# Patient Record
Sex: Male | Born: 1960 | Race: Black or African American | Hispanic: No | Marital: Married | Smoking: Never smoker
Health system: Southern US, Community
[De-identification: ages and names within clinical notes are randomized; demographics above are authoritative.]

## PROBLEM LIST (undated history)

## (undated) DIAGNOSIS — E785 Hyperlipidemia, unspecified: Secondary | ICD-10-CM

## (undated) DIAGNOSIS — J45909 Unspecified asthma, uncomplicated: Secondary | ICD-10-CM

## (undated) DIAGNOSIS — I10 Essential (primary) hypertension: Secondary | ICD-10-CM

## (undated) DIAGNOSIS — M109 Gout, unspecified: Secondary | ICD-10-CM

## (undated) HISTORY — PX: HERNIA REPAIR: SHX51

---

## 2011-04-05 ENCOUNTER — Emergency Department: Payer: Self-pay | Admitting: Emergency Medicine

## 2011-05-09 ENCOUNTER — Emergency Department: Payer: Self-pay | Admitting: *Deleted

## 2011-05-09 LAB — BASIC METABOLIC PANEL
BUN: 10 mg/dL (ref 7–18)
Calcium, Total: 8.9 mg/dL (ref 8.5–10.1)
Co2: 28 mmol/L (ref 21–32)
Creatinine: 1.18 mg/dL (ref 0.60–1.30)
EGFR (African American): >60
Potassium: 4.1 mmol/L (ref 3.5–5.1)
Sodium: 144 mmol/L (ref 136–145)

## 2011-05-09 LAB — CBC
HCT: 45.7 % (ref 40.0–52.0)
HGB: 15.1 g/dL (ref 13.0–18.0)
MCH: 30.4 pg (ref 26.0–34.0)
MCHC: 33.1 g/dL (ref 32.0–36.0)
MCV: 92 fL (ref 80–100)
RDW: 13.6 % (ref 11.5–14.5)

## 2011-05-09 LAB — TROPONIN I: Troponin-I: <0.02 ng/mL

## 2013-10-28 ENCOUNTER — Ambulatory Visit: Payer: Self-pay | Admitting: Physician Assistant

## 2013-10-28 LAB — URIC ACID: Uric Acid: 7.5 mg/dL — ABNORMAL HIGH (ref 3.5–7.2)

## 2013-10-28 LAB — CBC WITH DIFFERENTIAL/PLATELET
Basophil #: 0 10*3/uL (ref 0.0–0.1)
Basophil %: 0.4 %
EOS ABS: 0.2 10*3/uL (ref 0.0–0.7)
Eosinophil %: 1.4 %
HCT: 43.7 % (ref 40.0–52.0)
HGB: 14.1 g/dL (ref 13.0–18.0)
LYMPHS ABS: 1.6 10*3/uL (ref 1.0–3.6)
Lymphocyte %: 14.1 %
MCH: 29.7 pg (ref 26.0–34.0)
MCHC: 32.4 g/dL (ref 32.0–36.0)
MCV: 92 fL (ref 80–100)
MONOS PCT: 8.3 %
Monocyte #: 0.9 x10 3/mm (ref 0.2–1.0)
Neutrophil #: 8.5 10*3/uL — ABNORMAL HIGH (ref 1.4–6.5)
Neutrophil %: 75.8 %
Platelet: 197 10*3/uL (ref 150–440)
RBC: 4.76 10*6/uL (ref 4.40–5.90)
RDW: 13.4 % (ref 11.5–14.5)
WBC: 11.2 10*3/uL — AB (ref 3.8–10.6)

## 2013-12-21 IMAGING — CR DG CHEST 2V
1 series · 2 of 2 positions shown · non-contrast
Comparison: none

REASON FOR EXAM: L-sided rib/back pain
COMMENTS:

PROCEDURE:     DXR - DXR CHEST PA (OR AP) AND LATERAL  - May 09, 2011  [DATE]
RESULT:     There is no pneumothorax, infiltrate, edema, effusion or
cardiomegaly. The bony structures appear intact.

[Series 1: w chest pa · 0.14mm/px · 2 of 2 slices shown]
[im 1/2]
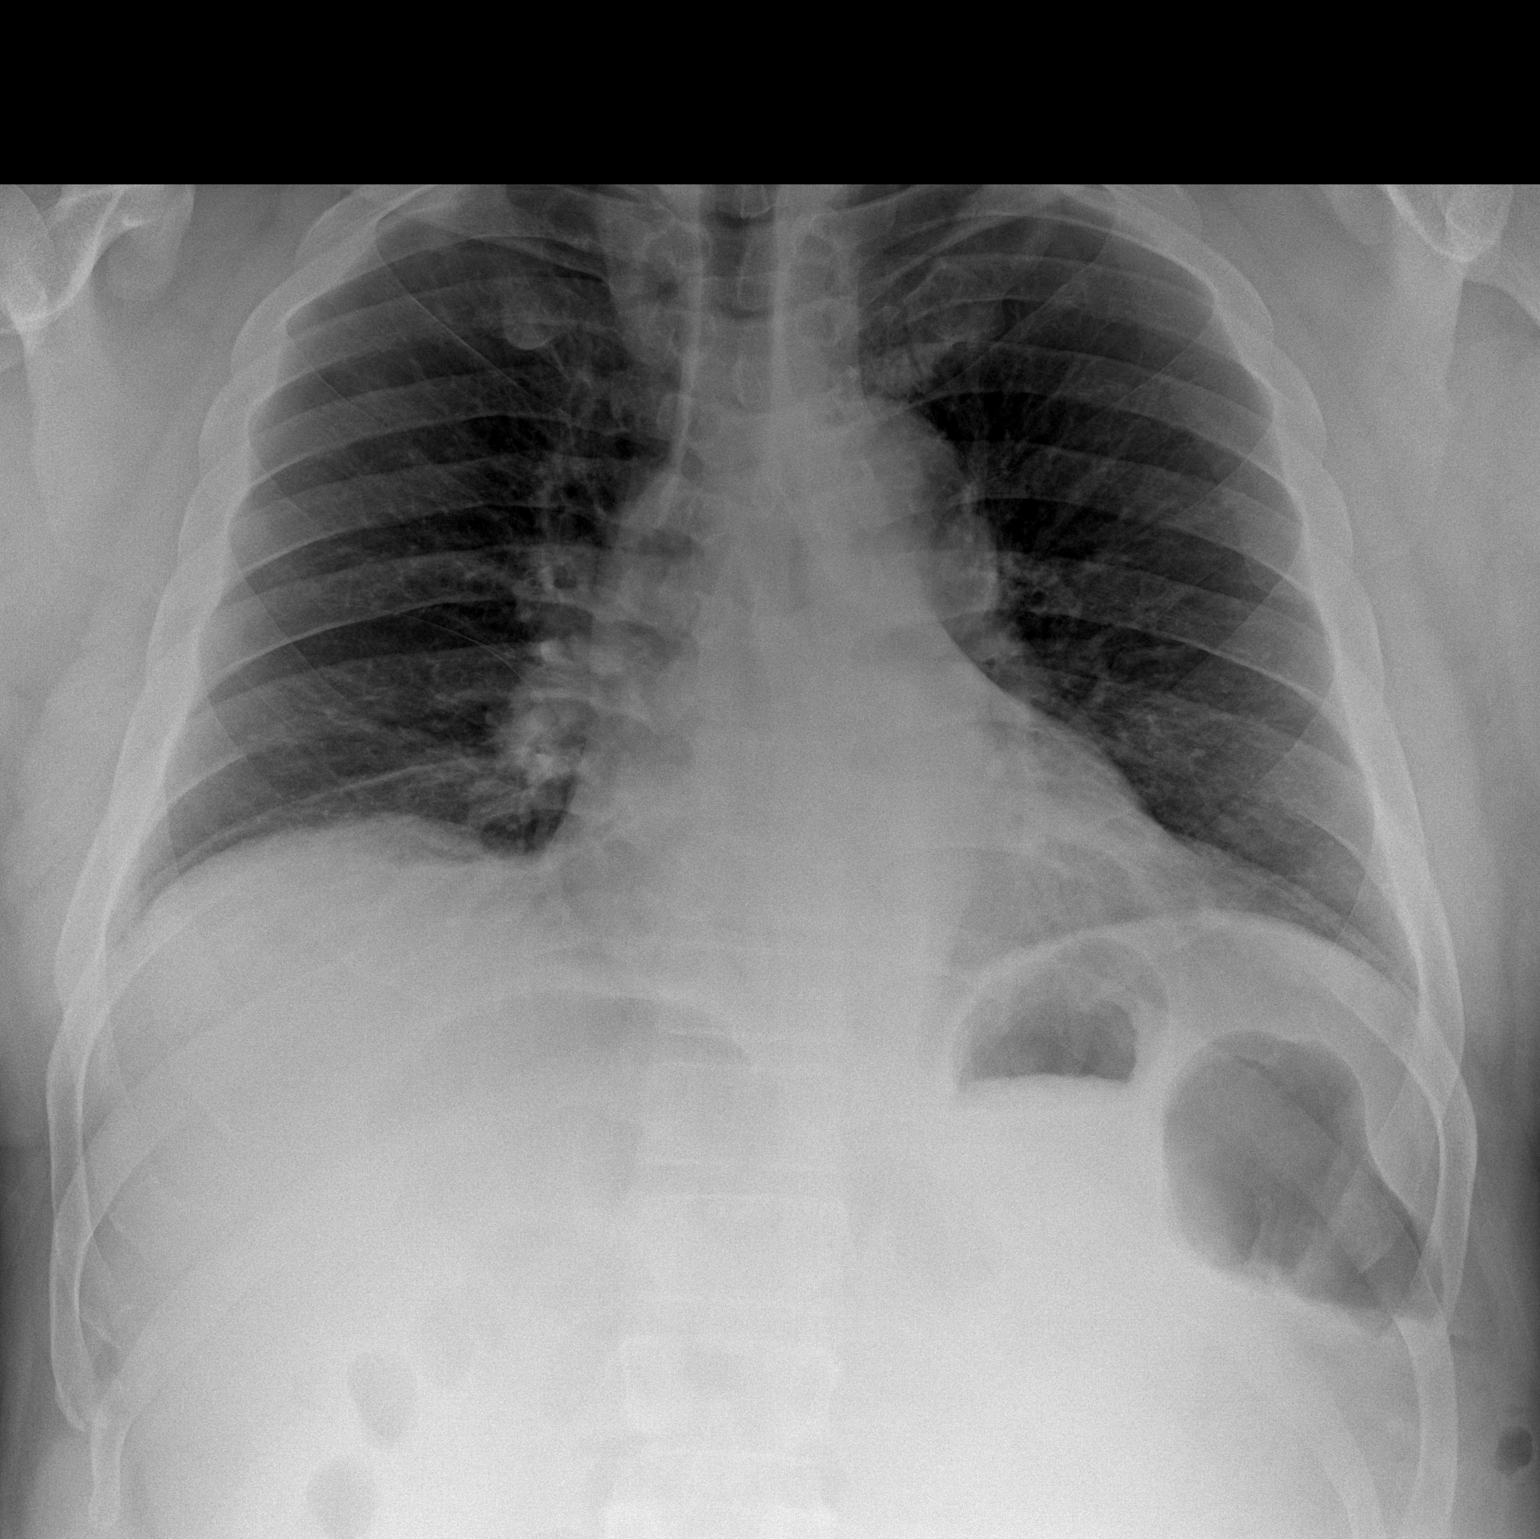
[im 2/2]
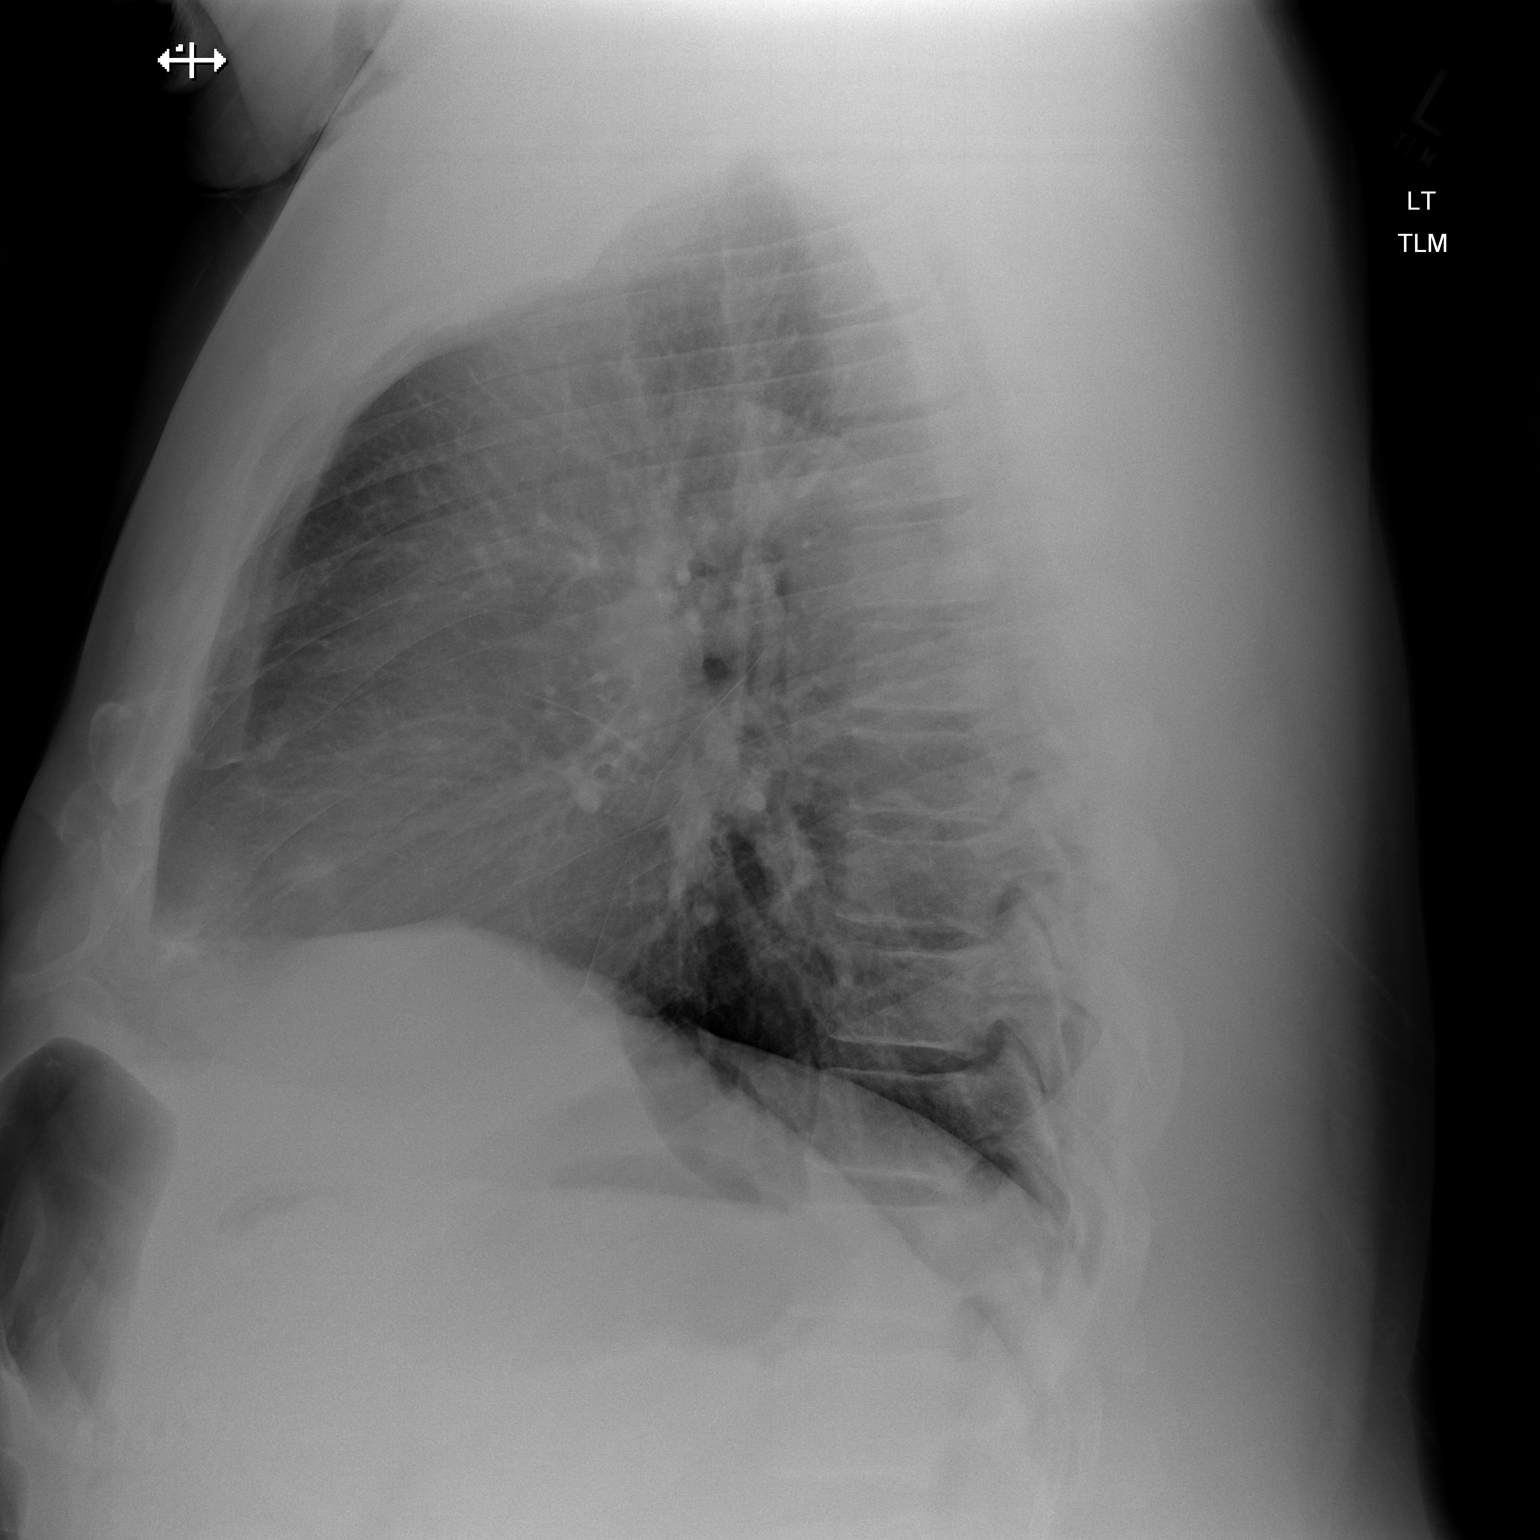

[2 of 2 positions shown; findings below may reference images not displayed]

IMPRESSION: No acute cardiopulmonary disease. Stable appearance since
05 April, 2011.

## 2016-01-02 ENCOUNTER — Ambulatory Visit
Admission: EM | Admit: 2016-01-02 | Discharge: 2016-01-02 | Disposition: A | Discharge status: Home / Self Care | Payer: BLUE CROSS/BLUE SHIELD | Attending: Family Medicine | Admitting: Family Medicine

## 2016-01-02 DIAGNOSIS — S0501XA Injury of conjunctiva and corneal abrasion without foreign body, right eye, initial encounter: Secondary | ICD-10-CM

## 2016-01-02 MED ORDER — TETRACAINE HCL 0.5 % OP SOLN: 2.0000 [drp] | Freq: Once | OPHTHALMIC | Status: DC

## 2016-01-02 MED ORDER — GENTAMICIN SULFATE 0.3 % OP SOLN: 1.0000 [drp] | Freq: Four times a day (QID) | OPHTHALMIC | 0 refills | Status: AC

## 2016-01-02 MED ORDER — FLUORESCEIN SODIUM 1 MG OP STRP: 1.0000 | ORAL_STRIP | Freq: Once | OPHTHALMIC | Status: DC

## 2016-01-02 NOTE — ED Provider Notes (Signed)
CSN: [id number]     Arrival date & time 01/02/16  0909 History   First MD Initiated Contact with Patient 01/02/16 1014     Chief Complaint  Patient presents with  . Foreign Body in Eye   (Consider location/radiation/quality/duration/timing/severity/associated sxs/prior Treatment) 55 year old male presents with right eye irritation and swelling that started this morning. He was outside playing horseshoes with friends yesterday when he thinks he may have gotten some dirt in his eyes. Left eye feels fine but woke up with right eye pain and sensitive to light. Has not placed any drops in his eyes. Does not wear contacts. Denies any discharge. Also no history of elevated blood pressure.    The history is provided by the patient.    History reviewed. No pertinent past medical history. History reviewed. No pertinent surgical history. History reviewed. No pertinent family history. Social History  Substance Use Topics  . Smoking status: Never Smoker  . Smokeless tobacco: Never Used  . Alcohol use Yes     Comment: several times per week    Review of Systems  Constitutional: Negative for chills, fatigue and fever.  HENT: Negative for congestion, ear pain and rhinorrhea.   Eyes: Positive for photophobia, pain and redness. Negative for discharge, itching and visual disturbance.  Respiratory: Negative for cough, chest tightness, shortness of breath and wheezing.   Cardiovascular: Negative for chest pain.  Neurological: Negative for dizziness, weakness and headaches.    Allergies  Review of patient's allergies indicates no known allergies.  Home Medications   Prior to Admission medications   Medication Sig Start Date End Date Taking? Authorizing Provider  gentamicin (GARAMYCIN) 0.3 % ophthalmic solution Place 1 drop into the right eye 4 (four) times daily. 01/02/16 01/07/16  Sudie GrumblingAnn Berry , NP   Meds Ordered and Administered this Visit   Medications - No data to display  BP (!) 176/90  (BP Location: Left Arm)   Pulse 76   Temp 97.8 F (36.6 C) (Oral)   Resp 20   Ht 6\' 4"  (1.93 m)   Wt (!) 345 lb (156.5 kg)   SpO2 96%   BMI 41.99 kg/m  No data found.   Physical Exam  Constitutional: He is oriented to person, place, and time. He appears well-developed and well-nourished. No distress.  HENT:  Head: Normocephalic and atraumatic.  Right Ear: Hearing, tympanic membrane, external ear and ear canal normal.  Left Ear: Hearing, tympanic membrane, external ear and ear canal normal.  Nose: Nose normal.  Mouth/Throat: Uvula is midline, oropharynx is clear and moist and mucous membranes are normal.  Eyes: EOM and lids are normal. Pupils are equal, round, and reactive to light. Right eye exhibits no discharge, no exudate and no hordeolum. No foreign body present in the right eye. Left eye exhibits no discharge, no exudate and no hordeolum. No foreign body present in the left eye. Right conjunctiva is injected. Left conjunctiva is not injected.  Fundoscopic exam:      The right eye shows no papilledema. The right eye shows red reflex.       The left eye shows no papilledema. The left eye shows red reflex.  Slit lamp exam:      The right eye shows corneal abrasion. The right eye shows no corneal ulcer and no foreign body.       The left eye shows no corneal ulcer and no foreign body.    Small corneal abrasion present at 9 o'clock just lateral to  iris as well as small slit-like abrasion present around 7 o'clock on right eye. No foreign bodies seen.   Cardiovascular: Normal rate, regular rhythm and normal heart sounds.   Pulmonary/Chest: Effort normal and breath sounds normal.  Neurological: He is alert and oriented to person, place, and time.  Skin: Skin is warm and dry.  Psychiatric: He has a normal mood and affect. His behavior is normal. Judgment and thought content normal.    Urgent Care Course   Clinical Course    Procedures (including critical care time)  Labs  Review Labs Reviewed - No data to display  Imaging Review No results found.   Visual Acuity Review  Right Eye Distance:  (20/100 uncorrected) Left Eye Distance:  (20/40 uncorrected) Bilateral Distance:  (20/40 uncorrected)  Right Eye Near:   Left Eye Near:    Bilateral Near:         MDM   1. Corneal abrasion, right, initial encounter   2. Incidental finding of elevated blood pressure  Discussed that corneal abrasion should resolve on own within 48 hours. Recommend Gentamicin eye drops in right eye to prevent infection. Follow-up with an eye doctor in 2 days if not improving. Also discussed that he needs to recheck his blood pressure- may be elevated today due to pain from eye condition. Recommend patient follow-up with a PCP if blood pressure remains >140/90.    Sudie Grumbling, NP 01/03/16 [phone]

## 2016-01-02 NOTE — Discharge Instructions (Signed)
Start Gentamicin (antibiotic) drops to prevent infection due to a scratch on your right eye. Follow-up with an eye doctor if symptoms do not resolve within 3 days.

## 2016-01-02 NOTE — ED Triage Notes (Signed)
Pt awoke today feeling like something was in his eye. Denies drainage. Pain 5/10

## 2016-06-11 IMAGING — CR LEFT WRIST - COMPLETE 3+ VIEW
3 series · 3 of 3 positions shown · non-contrast
Comparison: None.

CLINICAL DATA: Left wrist pain and swelling.  No known injuries.

EXAM:
LEFT WRIST - COMPLETE 3+ VIEW

[wrist pa]
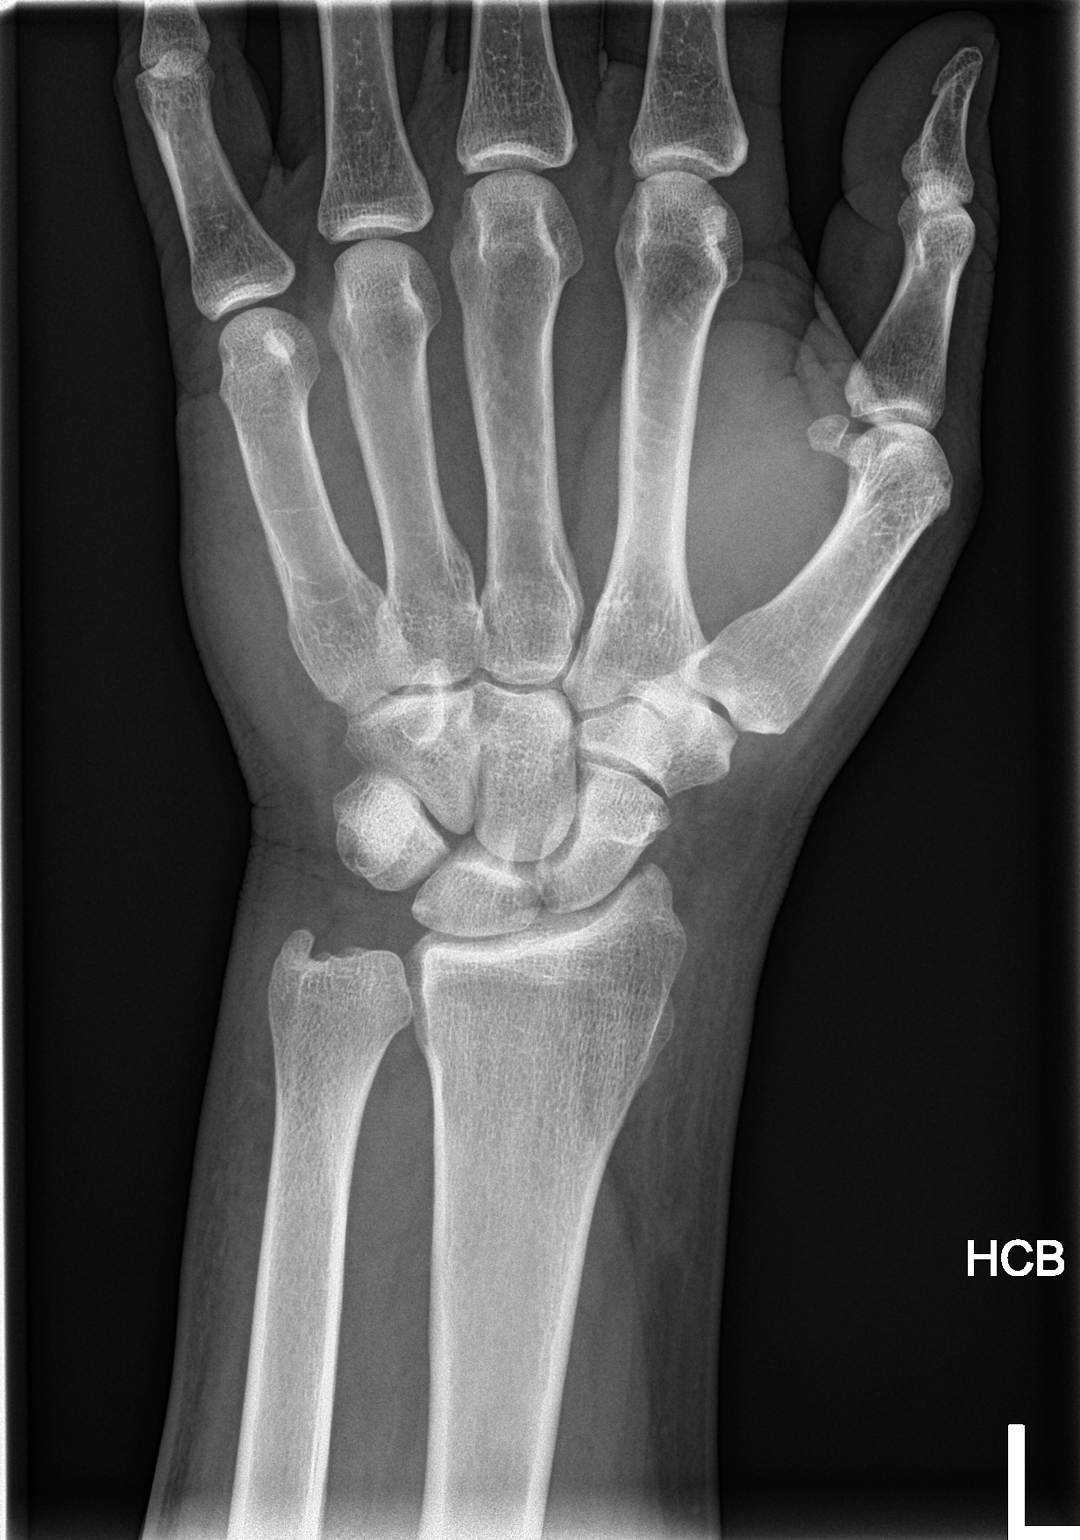

[wrist obl]
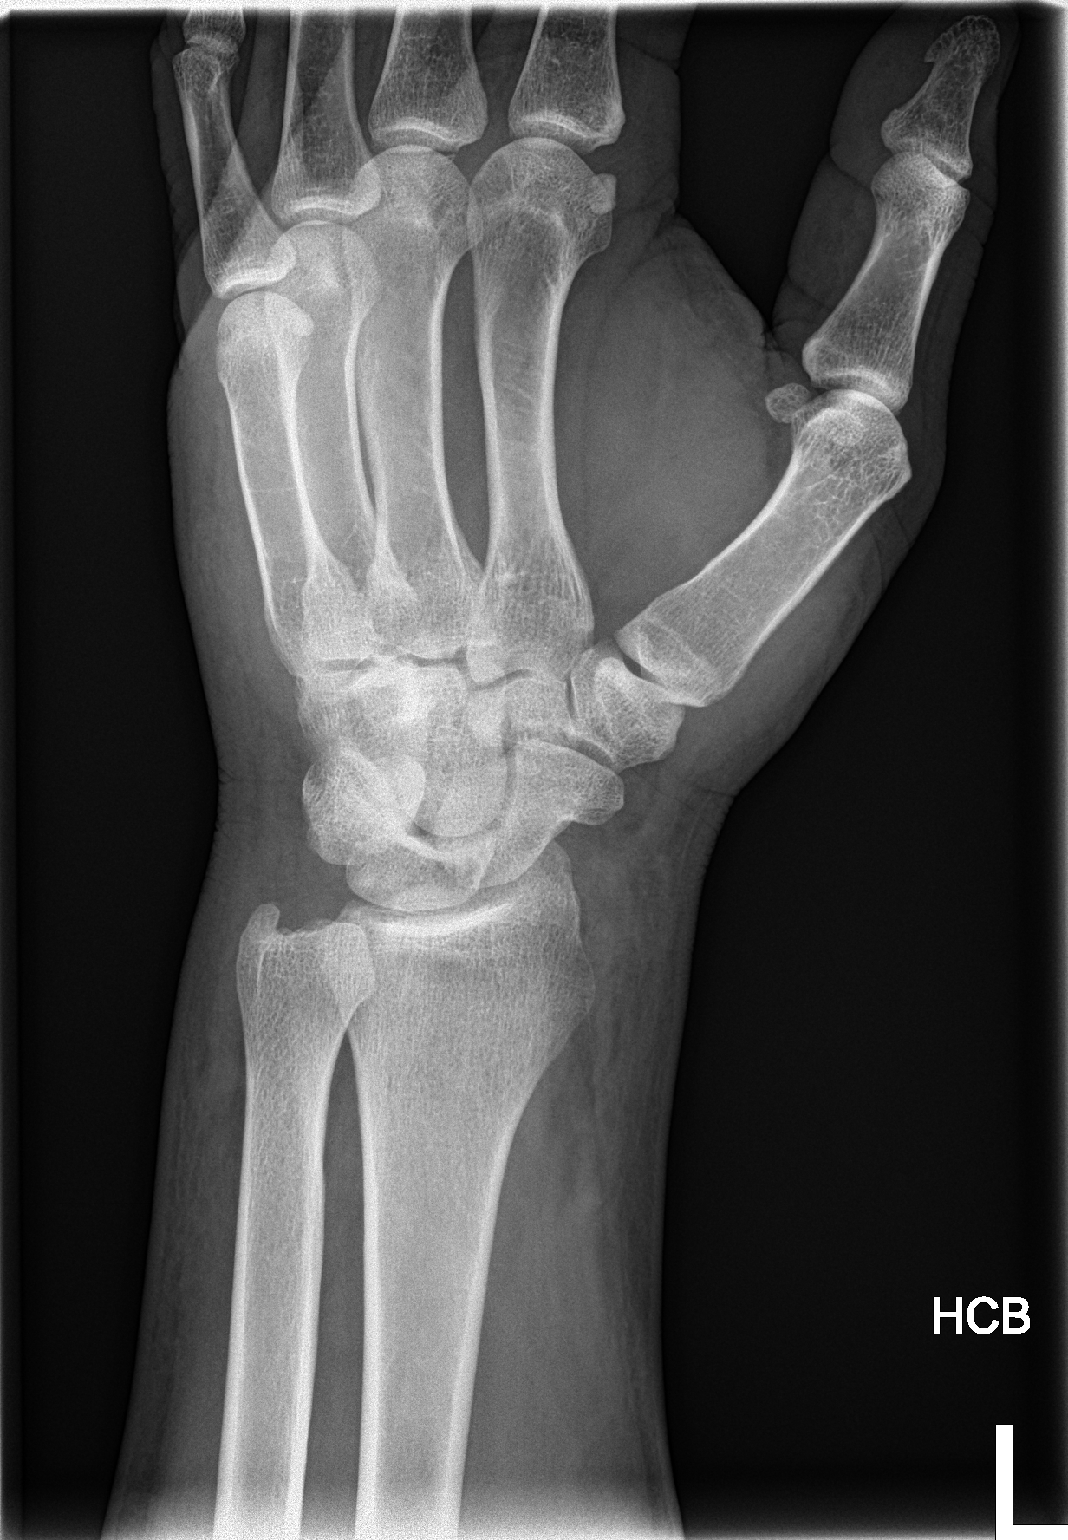

[wrist lat]
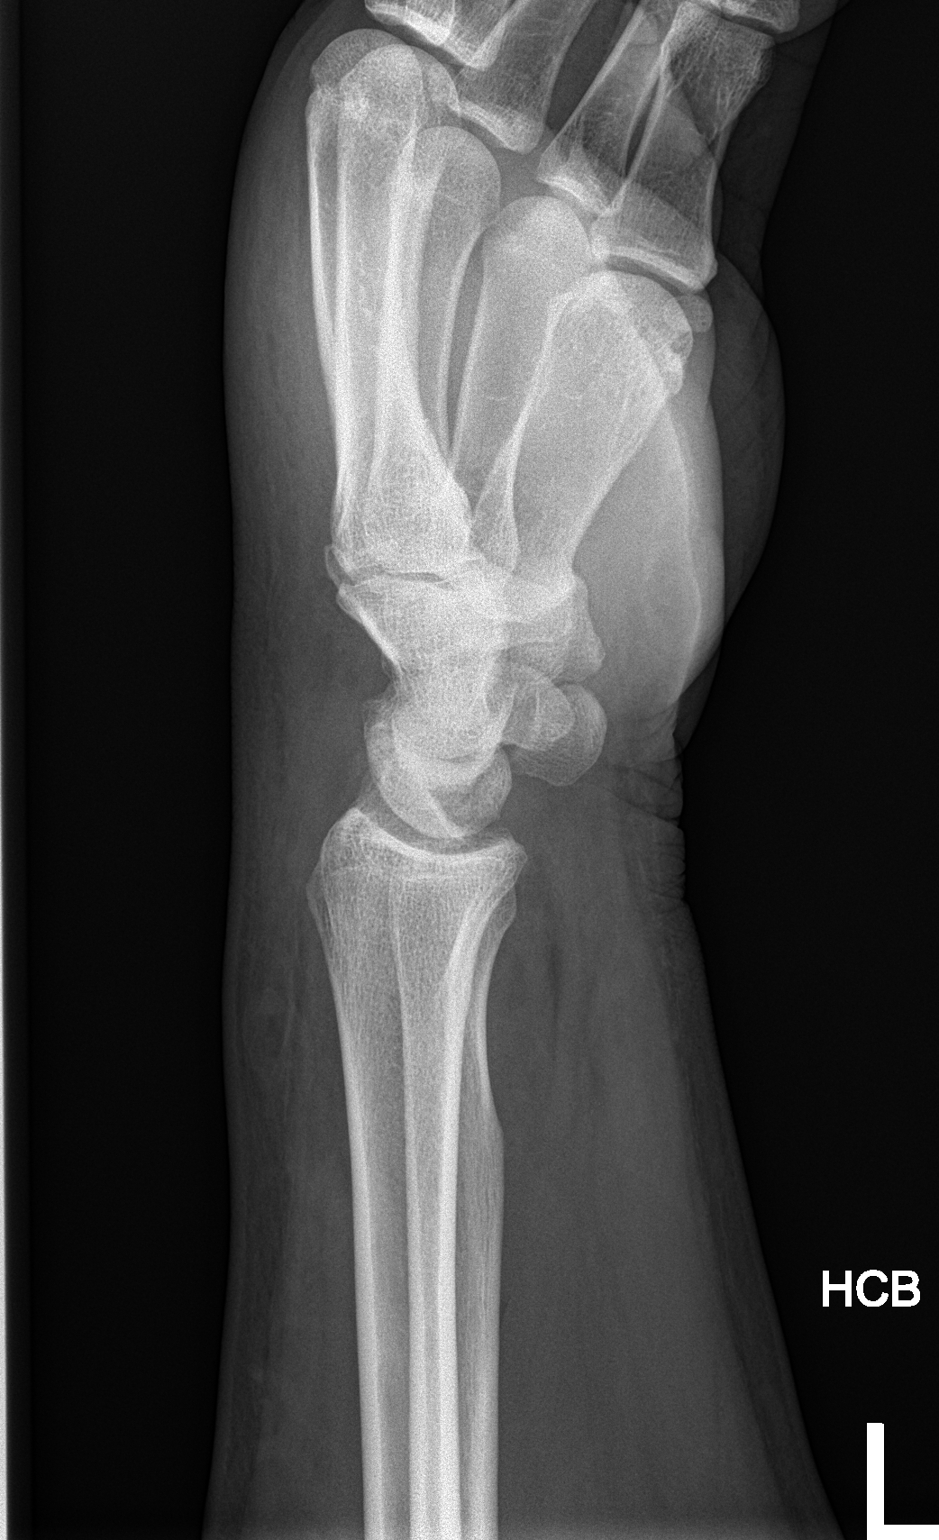

[3 of 3 positions shown; findings below may reference images not displayed]

FINDINGS: Dorsal soft tissue swelling overlying the wrist and the metacarpals
of the hand. Probable joint effusion involving the wrist. No
evidence of acute or subacute fracture or dislocation. Well
preserved joint spaces. Well preserved bone mineral density. No
erosions or other intrinsic osseous abnormality.
IMPRESSION: No osseous abnormality.  Probable joint effusion.

## 2019-01-05 ENCOUNTER — Other Ambulatory Visit: Payer: Self-pay

## 2019-01-05 DIAGNOSIS — Z20822 Contact with and (suspected) exposure to covid-19: Secondary | ICD-10-CM

## 2019-01-06 LAB — NOVEL CORONAVIRUS, NAA: SARS-CoV-2, NAA: NOT DETECTED

## 2019-06-18 ENCOUNTER — Ambulatory Visit: Payer: Self-pay | Attending: Internal Medicine

## 2019-06-18 ENCOUNTER — Other Ambulatory Visit: Payer: Self-pay

## 2019-06-18 DIAGNOSIS — Z23 Encounter for immunization: Secondary | ICD-10-CM

## 2019-06-18 NOTE — Progress Notes (Signed)
   Covid-19 Vaccination Clinic  Name:  Dylan Travis    MRN: [id number] DOB: [birth year]  06/18/2019  Mr. [name] was observed post Covid-19 immunization for 15 minutes without incident. He was provided with Vaccine Information Sheet and instruction to access the V-Safe system.   Mr. [name] was instructed to call 911 with any severe reactions post vaccine: Marland Kitchen Difficulty breathing  . Swelling of face and throat  . A fast heartbeat  . A bad rash all over body  . Dizziness and weakness   Immunizations Administered    Name Date Dose VIS Date Route   Pfizer COVID-19 Vaccine 06/18/2019 11:53 AM 0.3 mL 03/18/2019 Intramuscular   Manufacturer: [employer], [employer]   Lot: [id number]   NDC: [address]-6

## 2019-07-13 ENCOUNTER — Ambulatory Visit: Payer: Self-pay | Attending: Internal Medicine

## 2019-07-13 DIAGNOSIS — Z23 Encounter for immunization: Secondary | ICD-10-CM

## 2019-07-13 NOTE — Progress Notes (Signed)
   Covid-19 Vaccination Clinic  Name:  Dylan Travis    MRN: [id number] DOB: Jun 18, 1960  07/13/2019  [name] was observed post Covid-19 immunization for 15 minutes without incident. He was provided with Vaccine Information Sheet and instruction to access the V-Safe system.   [name] was instructed to call 911 with any severe reactions post vaccine: Marland Kitchen Difficulty breathing  . Swelling of face and throat  . A fast heartbeat  . A bad rash all over body  . Dizziness and weakness   Immunizations Administered    Name Date Dose VIS Date Route   Pfizer COVID-19 Vaccine 07/13/2019  4:17 PM 0.3 mL 03/18/2019 Intramuscular   Manufacturer: [employer], [employer]   Lot: [id number]   NDC: [address]-0

## 2019-09-08 ENCOUNTER — Other Ambulatory Visit: Payer: Self-pay

## 2019-09-08 ENCOUNTER — Ambulatory Visit
Admission: EM | Admit: 2019-09-08 | Discharge: 2019-09-08 | Disposition: A | Discharge status: Home / Self Care | Payer: Managed Care, Other (non HMO)

## 2019-09-08 ENCOUNTER — Encounter: Payer: Self-pay | Admitting: Emergency Medicine

## 2019-09-08 DIAGNOSIS — M10271 Drug-induced gout, right ankle and foot: Secondary | ICD-10-CM

## 2019-09-08 DIAGNOSIS — M79671 Pain in right foot: Secondary | ICD-10-CM

## 2019-09-08 HISTORY — DX: Essential (primary) hypertension: I10

## 2019-09-08 HISTORY — DX: Gout, unspecified: M10.9

## 2019-09-08 HISTORY — DX: Hyperlipidemia, unspecified: E78.5

## 2019-09-08 HISTORY — DX: Unspecified asthma, uncomplicated: J45.909

## 2019-09-08 MED ORDER — PREDNISONE 10 MG (21) PO TBPK: ORAL_TABLET | ORAL | 0 refills | Status: DC

## 2019-09-08 NOTE — Discharge Instructions (Signed)
It was very nice seeing you today in clinic. Thank you for entrusting me with your care.   Ice and elevate your foot. Use medications as prescribed. Make sure you are drinking lots of water.   Make arrangements to follow up with your regular doctor in 1 week for re-evaluation if not improving. If your symptoms/condition worsens, please seek follow up care either here or in the ER. Please remember, our [facility] providers are "right here with you" when you need [place].   Again, it was my pleasure to take care of you today. Thank you for choosing our clinic. I hope that you start to feel better quickly.   Quentin Mulling, MSN, APRN, FNP-C, CEN Advanced Practice Provider [facility] [place] [facility]

## 2019-09-08 NOTE — ED Triage Notes (Signed)
Patient in today c/o left foot pain x 1 week. Patient has a history of gout. Patient saw PCP on 08/29/19 and his HTN medication was changed. Patient states PCP told him that the new medication may cause a flare in his gout.

## 2019-09-09 NOTE — ED Provider Notes (Addendum)
[place], [place]   Name: Dylan Travis DOB: 1961-03-17 MRN: [id number] CSN: [id number] PCP: Patient, No Pcp Per  Arrival date and time:  09/08/19 1241  Chief Complaint:  Foot Pain (left)  NOTE: Prior to seeing the patient today, I have reviewed the triage nursing documentation and vital signs. Clinical staff has updated patient's PMH/PSHx, current medication list, and drug allergies/intolerances to ensure comprehensive history available to assist in medical decision making.   History:   HPI: Dylan Travis is a 59 y.o. male who presents today with complaints of pain in his LEFT foot that started on 09/02/2019. Symptoms have progressively worsened. Patient reports a PMH that is (+) for gout with infrequent flares. Patient is aware of triggering foods and denies recent purine rich intake. Of note, patient started on new thiazide diuretic (chlorthalidone) the day before his symptoms started last week (09/01/2019), which is PCP advised him was a distinct possibility given his history of gout. Patient has been conservatively managing his symptoms with APAP and IBU, however notes that his pain continues to worsen. He reports (+) difficulties with ambulation secondary to his foot pain. He presents today with an elevated blood pressure, which patient attributes to his pain that he rates 8/10. He denies associated chest pain, shortness of breath, palpitations, and headaches.  Past Medical History:  Diagnosis Date  . Asthma   . Gout   . Hyperlipidemia   . Hypertension     Past Surgical History:  Procedure Laterality Date  . HERNIA REPAIR      Family History  Problem Relation Age of Onset  . Hypertension Mother   . Diabetes Mother   . Cancer Father   . Hypertension Father     Social History   Tobacco Use  . Smoking status: Never Smoker  . Smokeless tobacco: Never Used  Substance Use Topics  . Alcohol use: Yes    Alcohol/week: 8.0 standard drinks    Types: 6 Cans of beer, 2 Standard drinks  or equivalent per week    Comment: weekends  . Drug use: No    There are no problems to display for this patient.   Home Medications:    Current Meds  Medication Sig  . albuterol (VENTOLIN HFA) 108 (90 Base) MCG/ACT inhaler Inhale 2 puffs into the lungs every 4 (four) hours as needed for wheezing or shortness of breath.  . allopurinol (ZYLOPRIM) 100 MG tablet Take 100 mg by mouth daily.  Marland Kitchen atorvastatin (LIPITOR) 40 MG tablet Take 40 mg by mouth at bedtime.  . chlorthalidone (HYGROTON) 25 MG tablet Take 25 mg by mouth daily.  . metoprolol succinate (TOPROL-XL) 25 MG 24 hr tablet Take 25 mg by mouth daily.    Allergies:   Patient has no known allergies.  Review of Systems (ROS):  Review of systems NEGATIVE unless otherwise noted in narrative H&P section.   Vital Signs: Today's Vitals   09/08/19 1259 09/08/19 1307 09/08/19 1323  BP:  (!) 153/106   Pulse:  100   Resp:  18   Temp:  98.4 F (36.9 C)   TempSrc:  Oral   SpO2:  94%   Weight: (!) 370 lb (167.8 kg)    Height: 6\' 4"  (1.93 m)    PainSc: 8   8     Physical Exam: Physical Exam  Constitutional: He is oriented to person, place, and time and well-developed, well-nourished, and in no distress.  HENT:  Head: Normocephalic and atraumatic.  Eyes: Pupils are equal, round,  and reactive to light.  Cardiovascular: Normal rate, regular rhythm, normal heart sounds and intact distal pulses.  Pulmonary/Chest: Effort normal and breath sounds normal.  Musculoskeletal:     Left foot: Normal range of motion. Swelling and tenderness present. No deformity or crepitus.       Feet:     Comments: LEFT foot swollen, erythematous, and TTP. (+) PMS noted distally; capillary refill WNL.   Neurological: He is alert and oriented to person, place, and time. He has normal sensation, normal strength and normal reflexes. Gait (2/2 acute pain) abnormal.  Skin: Skin is warm and dry. No rash noted. He is not diaphoretic.  Psychiatric: Memory,  affect and judgment normal. His mood appears anxious.  Nursing note and vitals reviewed.   Urgent Care Treatments / Results:   No orders of the defined types were placed in this encounter.   LABS: PLEASE NOTE: all labs that were ordered this encounter are listed, however only abnormal results are displayed. Labs Reviewed - No data to display  EKG: -None  RADIOLOGY: No results found.  PROCEDURES: Procedures  MEDICATIONS RECEIVED THIS VISIT: Medications - No data to display  PERTINENT CLINICAL COURSE NOTES/UPDATES:   Initial Impression / Assessment and Plan / Urgent Care Course:  Pertinent labs & imaging results that were available during my care of the patient were personally reviewed by me and considered in my medical decision making (see lab/imaging section of note for values and interpretations).  Dylan Travis is a 59 y.o. male who presents to [facility] today with complaints of Foot Pain (left)  Patient is well appearing overall in clinic today. He does not appear to be in any acute distress. Presenting symptoms (see HPI) and exam as documented above. Exam consistent with acute flare of patient's known gout. He takes daily anti-gout medication (allopurinol), however it is at a relatively low dose (100 mg). Given his acute flare, will defer up titration of his allopurinol dose to avoid exacerbation of his current flare. He was encouraged to discussed dose increase with his PCP at his next visit. Treating acute flare with a 10 day systemic steroid (prednisone) taper. Reviewed supportive care measures; rest, ice, elevation, and PRN use of APAP for pain. Advised to avoid concurrent NSAIDs use while on steroids in efforts to prevent elevations of his blood pressure. He was advised to increase his oral fluid (water) intake.   Current clinical condition warrants patient being out of work in order to recover from his current injury/illness. He was provided with the appropriate  documentation to provide to his place of employment that will allow for him to RTW on 09/12/2019 with no restrictions.   Discussed follow up with primary care physician in 1 week for re-evaluation. I have reviewed the follow up and strict return precautions for any new or worsening symptoms. Patient is aware of symptoms that would be deemed urgent/emergent, and would thus require further evaluation either here or in the emergency department. At the time of discharge, he verbalized understanding and consent with the discharge plan as it was reviewed with him. All questions were fielded by provider and/or clinic staff prior to patient discharge.    Final Clinical Impressions / Urgent Care Diagnoses:   Final diagnoses:  Acute drug-induced gout of right foot  Foot pain, right    New Prescriptions:  [place] Controlled Substance Registry consulted? Not Applicable  Meds ordered this encounter  Medications  . predniSONE (STERAPRED UNI-PAK 21 TAB) 10 MG (21) TBPK tablet  Sig: Take 5 tabs by mouth daily for 2 days, then 4 tabs for 2 days, then 3 tabs for 2 days, 2 tabs for 2 days, then 1 tab by mouth daily for 2 days    Dispense:  30 tablet    Refill:  0    Recommended Follow up Care:  Patient encouraged to follow up with the following provider within the specified time frame, or sooner as dictated by the severity of his symptoms. As always, he was instructed that for any urgent/emergent care needs, he should seek care either here or in the emergency department for more immediate evaluation.  Follow-up Information    PCP In 1 week.   Why: General reassessment of symptoms if not improving        NOTE: This note was prepared using Lobbyist along with smaller Company secretary. Despite my best ability to proofread, there is the potential that transcriptional errors may still occur from this process, and are completely unintentional.    Karen Kitchens, NP 09/09/19 1706

## 2020-05-30 ENCOUNTER — Encounter: Payer: Self-pay | Admitting: Emergency Medicine

## 2020-05-30 ENCOUNTER — Ambulatory Visit
Admission: EM | Admit: 2020-05-30 | Discharge: 2020-05-30 | Disposition: A | Discharge status: Home / Self Care | Payer: Managed Care, Other (non HMO) | Attending: Physician Assistant | Admitting: Physician Assistant

## 2020-05-30 ENCOUNTER — Other Ambulatory Visit: Payer: Self-pay

## 2020-05-30 DIAGNOSIS — S161XXA Strain of muscle, fascia and tendon at neck level, initial encounter: Secondary | ICD-10-CM | POA: Diagnosis not present

## 2020-05-30 DIAGNOSIS — M542 Cervicalgia: Secondary | ICD-10-CM | POA: Diagnosis not present

## 2020-05-30 MED ORDER — DICLOFENAC SODIUM 75 MG PO TBEC: 75.0000 mg | DELAYED_RELEASE_TABLET | Freq: Two times a day (BID) | ORAL | 0 refills | Status: AC

## 2020-05-30 MED ORDER — TIZANIDINE HCL 4 MG PO TABS: 4.0000 mg | ORAL_TABLET | Freq: Three times a day (TID) | ORAL | 0 refills | Status: AC | PRN

## 2020-05-30 NOTE — ED Triage Notes (Signed)
Pt c/o neck pain. Started about 4 days ago. Denies injury.

## 2020-05-30 NOTE — Discharge Instructions (Signed)
NECK PAIN: Stressed avoiding painful activities. This can exacerbate your symptoms and make them worse.  May apply heat to the areas of pain for some relief. Use medications as directed. Be aware of which medications make you drowsy and do not drive or operate any kind of heavy machinery while using the medication (ie pain medications or muscle relaxers). F/U with PCP for reexamination or return sooner if condition worsens or does not begin to improve over the next few days.   NECK PAIN RED FLAGS: If symptoms get worse than they are right now, you should come back sooner for re-evaluation. If you have increased numbness/ tingling or notice that the numbness/tingling is affecting the legs or saddle region, go to ER. If you ever lose continence go to ER.     

## 2020-05-30 NOTE — ED Provider Notes (Signed)
[facility]    CSN: [id number] Arrival date & time: 05/30/20  0809      History   Chief Complaint Chief Complaint  Patient presents with  . Neck Pain    HPI Dylan Travis is a 60 y.o. male presenting for right-sided neck pain x4 days.  Patient denies any injury and states that he noticed a gradually throughout the day.  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  Pain radiates slightly to the upper and posterior right shoulder.  Denies any radiation to arm.  No associated numbness, weakness or tingling.  Pain is worsened by movement, especially rotation toward the right.  He says he has tried multiple medications at home including Tylenol, NSAIDs and is also tried topical icy hot and heat without relief.  He denies any history of major neck injury or surgery.  His past medical history is significant for hypertension, hyperlipidemia, asthma and gout.  Patient states that he has chronically low oxygen saturation level in the low 90s.  He says he has had a full work-up for this including stress tests and imaging.  He states that he does not feel short of breath and does not have any associated chest pain.  No recent sickness.  Denies any fever, fatigue, cough, congestion or sore throat.  He has no other complaints or concerns today.  HPI  Past Medical History:  Diagnosis Date  . Asthma   . Gout   . Hyperlipidemia   . Hypertension     There are no problems to display for this patient.   Past Surgical History:  Procedure Laterality Date  . HERNIA REPAIR         Home Medications    Prior to Admission medications   Medication Sig Start Date End Date Taking? Authorizing Provider  allopurinol (ZYLOPRIM) 100 MG tablet Take 100 mg by mouth daily. 05/27/19  Yes [provider]  atorvastatin (LIPITOR) 40 MG tablet Take 40 mg by mouth at bedtime. 08/04/19  Yes [provider]  chlorthalidone (HYGROTON) 25 MG tablet  Take 25 mg by mouth daily. 09/02/19  Yes [provider]  diclofenac (VOLTAREN) 75 MG EC tablet Take 1 tablet (75 mg total) by mouth 2 (two) times daily for 15 days. 05/30/20 06/14/20 Yes Eusebio Friendly B, PA-C  metoprolol succinate (TOPROL-XL) 25 MG 24 hr tablet Take 25 mg by mouth daily. 08/29/19  Yes [provider]  tiZANidine (ZANAFLEX) 4 MG tablet Take 1 tablet (4 mg total) by mouth every 8 (eight) hours as needed for up to 10 days for muscle spasms. 05/30/20 06/09/20 Yes Shirlee Latch, PA-C  albuterol (VENTOLIN HFA) 108 (90 Base) MCG/ACT inhaler Inhale 2 puffs into the lungs every 4 (four) hours as needed for wheezing or shortness of breath. 12/09/18 12/09/19  [provider]  predniSONE (STERAPRED UNI-PAK 21 TAB) 10 MG (21) TBPK tablet Take 5 tabs by mouth daily for 2 days, then 4 tabs for 2 days, then 3 tabs for 2 days, 2 tabs for 2 days, then 1 tab by mouth daily for 2 days 09/08/19   [name], NP    Family History Family History  Problem Relation Age of Onset  . Hypertension Mother   . Diabetes Mother   . Cancer Father   . Hypertension Father     Social History Social History   Tobacco Use  . Smoking status: Never Smoker  . Smokeless tobacco: Never Used  Vaping Use  . Vaping Use: Never used  Substance Use Topics  . Alcohol use: Yes    Alcohol/week: 8.0 standard drinks    Types: 6 Cans of beer, 2 Standard drinks or equivalent per week    Comment: weekends  . Drug use: No     Allergies   Patient has no known allergies.   Review of Systems Review of Systems  Constitutional: Negative for fatigue and fever.  HENT: Negative for congestion.   Respiratory: Negative for cough and shortness of breath.   Cardiovascular: Negative for chest pain.  Musculoskeletal: Positive for neck pain. Negative for arthralgias, back pain and neck stiffness.  Skin: Negative for rash.  Neurological: Negative for dizziness, weakness and numbness.     Physical  Exam Triage Vital Signs ED Triage Vitals  Enc Vitals Group     BP 05/30/20 0826 (!) 150/88     Pulse Rate 05/30/20 0826 91     Resp 05/30/20 0826 18     Temp 05/30/20 0826 98.7 F (37.1 C)     Temp Source 05/30/20 0826 Oral     SpO2 05/30/20 0826 92 %     Weight 05/30/20 0824 (!) 369 lb 14.9 oz (167.8 kg)     Height 05/30/20 0824 6\' 4"  (1.93 m)     Head Circumference --      Peak Flow --      Pain Score 05/30/20 0824 8     Pain Loc --      Pain Edu? --      Excl. in GC? --    No data found.  Updated Vital Signs BP (!) 150/88 (BP Location: Right Arm)   Pulse 91   Temp 98.7 F (37.1 C) (Oral)   Resp 18   Ht 6\' 4"  (1.93 m)   Wt (!) 369 lb 14.9 oz (167.8 kg)   SpO2 92% Comment: pt states his o2 always runs in the low 90's  BMI 45.03 kg/m    Physical Exam Vitals and nursing note reviewed.  Constitutional:      General: He is not in acute distress.    Appearance: Normal appearance. He is well-developed and well-nourished. He is not ill-appearing.  HENT:     Head: Normocephalic and atraumatic.     Nose: Nose normal.     Mouth/Throat:     Mouth: Mucous membranes are moist.     Pharynx: Oropharynx is clear.  Eyes:     Conjunctiva/sclera: Conjunctivae normal.  Cardiovascular:     Rate and Rhythm: Normal rate and regular rhythm.     Heart sounds: Normal heart sounds.  Pulmonary:     Effort: Pulmonary effort is normal. No respiratory distress.     Breath sounds: Normal breath sounds. No wheezing, rhonchi or rales.  Musculoskeletal:        General: No edema.     Cervical back: Neck supple. Tenderness (diffuse TTP throughout right trapezius muscle. Painful movement with rotation to right. Slightly reduced ROM in all directions. No stiffness. 5/5 strength bilat UEs) present.  Skin:    General: Skin is warm and dry.  Neurological:     General: No focal deficit present.     Mental Status: He is alert. Mental status is at baseline.     Motor: No weakness.     Gait: Gait  normal.  Psychiatric:        Mood and Affect: Mood and affect and mood normal.  Behavior: Behavior normal.        Thought Content: Thought content normal.      UC Treatments / Results  Labs (all labs ordered are listed, but only abnormal results are displayed) Labs Reviewed - No data to display  EKG   Radiology No results found.  Procedures Procedures (including critical care time)  Medications Ordered in UC Medications - No data to display  Initial Impression / Assessment and Plan / UC Course  I have reviewed the triage vital signs and the nursing notes.  Pertinent labs & imaging results that were available during my care of the patient were reviewed by me and considered in my medical decision making (see chart for details).   60 year old male presenting for right-sided neck pain x4 days.  No injury.  Exam consistent with tenderness of the trapezius muscle, suspect cervical strain or sprain.  Treating patient conservatively at this time with diclofenac sodium, at home Tylenol, topical muscle rubs, and tizanidine as needed for muscle spasm/pain.  Also advised stretches at home.  Work note provided for today.  Patient advised of when to return and ED precautions for neck pain.  Final Clinical Impressions(s) / UC Diagnoses   Final diagnoses:  Acute strain of neck muscle, initial encounter  Neck pain     Discharge Instructions     NECK PAIN: Stressed avoiding painful activities. This can exacerbate your symptoms and make them worse.  May apply heat to the areas of pain for some relief. Use medications as directed. Be aware of which medications make you drowsy and do not drive or operate any kind of heavy machinery while using the medication (ie pain medications or muscle relaxers). F/U with PCP for reexamination or return sooner if condition worsens or does not begin to improve over the next few days.   NECK PAIN RED FLAGS: If symptoms get worse than they are right  now, you should come back sooner for re-evaluation. If you have increased numbness/ tingling or notice that the numbness/tingling is affecting the legs or saddle region, go to ER. If you ever lose continence go to ER.        ED Prescriptions    Medication Sig Dispense Auth. Provider   diclofenac (VOLTAREN) 75 MG EC tablet Take 1 tablet (75 mg total) by mouth 2 (two) times daily for 15 days. 30 tablet Eusebio Friendly B, PA-C   tiZANidine (ZANAFLEX) 4 MG tablet Take 1 tablet (4 mg total) by mouth every 8 (eight) hours as needed for up to 10 days for muscle spasms. 30 tablet Shirlee Latch, PA-C     I have reviewed the PDMP during this encounter.   Shirlee Latch, PA-C 05/30/20 [phone]

## 2020-08-14 ENCOUNTER — Ambulatory Visit
Admission: EM | Admit: 2020-08-14 | Discharge: 2020-08-14 | Disposition: A | Discharge status: Home / Self Care | Payer: Managed Care, Other (non HMO) | Attending: Physician Assistant | Admitting: Physician Assistant

## 2020-08-14 ENCOUNTER — Other Ambulatory Visit: Payer: Self-pay

## 2020-08-14 ENCOUNTER — Encounter: Payer: Self-pay | Admitting: Physician Assistant

## 2020-08-14 DIAGNOSIS — R197 Diarrhea, unspecified: Secondary | ICD-10-CM

## 2020-08-14 DIAGNOSIS — R1010 Upper abdominal pain, unspecified: Secondary | ICD-10-CM | POA: Diagnosis not present

## 2020-08-14 DIAGNOSIS — K529 Noninfective gastroenteritis and colitis, unspecified: Secondary | ICD-10-CM

## 2020-08-14 NOTE — ED Triage Notes (Addendum)
Patient c/o abdominal pain that started last night. He reports diarrhea that started yesterday. Denies nausea and vomiting. He reports he has been able to eat and drink normally. Patient took a home COVID test yesterday and this was negative.

## 2020-08-14 NOTE — Discharge Instructions (Addendum)
ABDOMINAL PAIN/GASTROENTERITIS: Use medications as directed including antiemetics and antidiarrheal medications (Imodium). You must increase fluids and electrolyte replacement, as well as rest over these next several days. If you have any questions or concerns, or if your symptoms are not improving or if especially if they acutely worsen, please call or stop back to the clinic immediately and we will be happy to help you or go to the ER   ABDOMINAL PAIN RED FLAGS: Seek immediate further care if: symptoms last more than 2 days, you are unable to keep fluids down, you see blood or mucus in your stool, you vomit black or dark red material, you have a fever of 101.F or higher, you have localized and/or persistent abdominal pain

## 2020-08-14 NOTE — ED Provider Notes (Signed)
[facility]    CSN: [id number] Arrival date & time: 08/14/20  1441      History   Chief Complaint Chief Complaint  Patient presents with  . Abdominal Pain  . Diarrhea    HPI Dylan Travis is a 60 y.o. male presenting for onset of upper abdominal pain and periumbilical pain last night.  Patient also states that he has had numerous episodes of diarrhea.  He denies any nausea or vomiting.  He has not had any fevers.  Appetite is normal.  Patient denies any cough, congestion, sore throat.  He denies any chest discomfort or shortness of breath.  He does however state that yesterday he had some left-sided rib pain that came and went throughout the day but has since resolved.  It was not associated with any dizziness, weakness, palpitations or sweats.  He denies any pain in his chest currently.  Denies any history of heart problems.  He does have history of asthma.  Also has history of hypertension and hyperlipidemia.  Patient states that his daughter and her child are both sick with similar symptoms but they seem to have more nausea and vomiting.  He believes he may have a "stomach bug."  He has not taken any over-the-counter medications.  He does request a work note due to his continued diarrhea.  He has not noticed any blood in the diarrhea.  He denies any recent antibiotic use or travel outside the country.  No other concerns.  HPI  Past Medical History:  Diagnosis Date  . Asthma   . Gout   . Hyperlipidemia   . Hypertension     There are no problems to display for this patient.   Past Surgical History:  Procedure Laterality Date  . HERNIA REPAIR         Home Medications    Prior to Admission medications   Medication Sig Start Date End Date Taking? Authorizing Provider  allopurinol (ZYLOPRIM) 100 MG tablet Take 100 mg by mouth daily. 05/27/19  Yes [provider]  atorvastatin (LIPITOR) 40 MG tablet Take 40 mg by mouth at bedtime. 08/04/19  Yes [provider]  metoprolol succinate (TOPROL-XL) 25 MG 24 hr tablet Take 25 mg by mouth daily. 08/29/19  Yes [provider]  albuterol (VENTOLIN HFA) 108 (90 Base) MCG/ACT inhaler Inhale 2 puffs into the lungs every 4 (four) hours as needed for wheezing or shortness of breath. 12/09/18 12/09/19  [provider]  chlorthalidone (HYGROTON) 25 MG tablet Take 25 mg by mouth daily. 09/02/19   [provider]  predniSONE (STERAPRED UNI-PAK 21 TAB) 10 MG (21) TBPK tablet Take 5 tabs by mouth daily for 2 days, then 4 tabs for 2 days, then 3 tabs for 2 days, 2 tabs for 2 days, then 1 tab by mouth daily for 2 days 09/08/19   [name], NP    Family History Family History  Problem Relation Age of Onset  . Hypertension Mother   . Diabetes Mother   . Cancer Father   . Hypertension Father     Social History Social History   Tobacco Use  . Smoking status: Never Smoker  . Smokeless tobacco: Never Used  Vaping Use  . Vaping Use: Never used  Substance Use Topics  . Alcohol use: Yes    Alcohol/week: 8.0 standard drinks    Types: 6 Cans of beer, 2 Standard drinks or equivalent per week    Comment: weekends  . Drug  use: No     Allergies   Patient has no known allergies.   Review of Systems Review of Systems  Constitutional: Negative for fatigue and fever.  HENT: Negative for congestion, rhinorrhea and sore throat.   Respiratory: Negative for cough and shortness of breath.   Gastrointestinal: Positive for abdominal pain and diarrhea. Negative for nausea and vomiting.  Musculoskeletal: Negative for myalgias.  Neurological: Negative for weakness, light-headedness and headaches.  Hematological: Negative for adenopathy.     Physical Exam Triage Vital Signs ED Triage Vitals  Enc Vitals Group     BP      Pulse      Resp      Temp      Temp src      SpO2      Weight      Height      Head Circumference      Peak Flow      Pain Score      Pain Loc      Pain  Edu?      Excl. in GC?    No data found.  Updated Vital Signs BP (!) 142/88 (BP Location: Left Arm)   Pulse 96   Temp 98.7 F (37.1 C) (Oral)   Resp 18   Ht 6\' 3"  (1.905 m)   Wt (!) 350 lb (158.8 kg)   SpO2 92%   BMI 43.75 kg/m       Physical Exam Vitals and nursing note reviewed.  Constitutional:      General: He is not in acute distress.    Appearance: Normal appearance. He is well-developed. He is obese. He is not ill-appearing.  HENT:     Head: Normocephalic and atraumatic.     Nose: Nose normal.     Mouth/Throat:     Mouth: Mucous membranes are moist.     Pharynx: Oropharynx is clear.  Eyes:     General: No scleral icterus.    Conjunctiva/sclera: Conjunctivae normal.  Cardiovascular:     Rate and Rhythm: Normal rate and regular rhythm.     Heart sounds: Normal heart sounds.  Pulmonary:     Effort: Pulmonary effort is normal. No respiratory distress.     Breath sounds: Normal breath sounds. No wheezing, rhonchi or rales.  Abdominal:     General: Bowel sounds are increased.     Palpations: Abdomen is soft.     Tenderness: There is abdominal tenderness in the epigastric area and periumbilical area.  Musculoskeletal:     Cervical back: Neck supple.  Skin:    General: Skin is warm and dry.  Neurological:     General: No focal deficit present.     Mental Status: He is alert. Mental status is at baseline.     Motor: No weakness.     Gait: Gait normal.  Psychiatric:        Mood and Affect: Mood normal.        Behavior: Behavior normal.        Thought Content: Thought content normal.      UC Treatments / Results  Labs (all labs ordered are listed, but only abnormal results are displayed) Labs Reviewed - No data to display  EKG   Radiology No results found.  Procedures ED EKG  Date/Time: 08/14/2020 4:33 PM Performed by: 10/14/2020, PA-C Authorized by: Shirlee Latch, PA-C   ECG reviewed by ED Physician in the absence of a cardiologist: yes  Previous ECG:    Previous ECG:  Unavailable Interpretation:    Interpretation: abnormal     Details:  Occasional PVCs Rate:    ECG rate assessment: normal   Rhythm:    Rhythm: sinus rhythm   Ectopy:    Ectopy: PVCs     PVCs:  Infrequent QRS:    QRS axis:  Normal   QRS intervals:  Normal   QRS conduction: normal   ST segments:    ST segments:  Normal T waves:    T waves: normal   Comments:     Sinus rhythm, regular rate, occasional PVCs   (including critical care time)  Medications Ordered in UC Medications - No data to display  Initial Impression / Assessment and Plan / UC Course  I have reviewed the triage vital signs and the nursing notes.  Pertinent labs & imaging results that were available during my care of the patient were reviewed by me and considered in my medical decision making (see chart for details).   60 year old male presenting for upper abdominal pain, periumbilical pain and diarrhea.  Symptoms began last night.  Admits to sick contacts with similar symptoms.  His blood pressure slightly elevated at 142/88.  He is afebrile and other vitals are stable.  His oxygen is decreased at 92% but it was also decreased to 92% a couple of months ago and he says that is not uncommon for him.  He is denying any current chest pain or shortness of breath.  An EKG was obtained today which shows normal sinus rhythm with rate of 95 bpm.  Occasional PVCs noted.  EKG obtained due to complaint of left sided rib pain yesterday.  I did offer to perform labs for patient including CBC, CMP, lipase and troponin to assess for possible other causes such as pancreatitis, cholecystitis or cholelithiasis, ACS, etc.  Patient declines work-up today.  Advised him that his symptoms are most likely related to viral gastroenteritis especially since he has contacts with similar symptoms.  Supportive care advised at this time with increasing rest and fluids.  Advised OTC Imodium since she does not have  any fever or dark black or bloody stools.  Advised him to follow-up here or in the ED if he has any acute worsening symptoms including if he develops a fever or has worsening abdominal pain or return of his chest pain.  Work note given.  Patient is agreeable to this.   Final Clinical Impressions(s) / UC Diagnoses   Final diagnoses:  Noninfectious gastroenteritis, unspecified type  Upper abdominal pain  Diarrhea, unspecified type     Discharge Instructions     ABDOMINAL PAIN/GASTROENTERITIS: Use medications as directed including antiemetics and antidiarrheal medications (Imodium). You must increase fluids and electrolyte replacement, as well as rest over these next several days. If you have any questions or concerns, or if your symptoms are not improving or if especially if they acutely worsen, please call or stop back to the clinic immediately and we will be happy to help you or go to the ER   ABDOMINAL PAIN RED FLAGS: Seek immediate further care if: symptoms last more than 2 days, you are unable to keep fluids down, you see blood or mucus in your stool, you vomit black or dark red material, you have a fever of 101.F or higher, you have localized and/or persistent abdominal pain     ED Prescriptions    None     PDMP not reviewed this encounter.  Shirlee Latch, PA-C 08/14/20 1636

## 2021-06-04 ENCOUNTER — Other Ambulatory Visit: Payer: Self-pay

## 2021-06-04 ENCOUNTER — Ambulatory Visit
Admission: EM | Admit: 2021-06-04 | Discharge: 2021-06-04 | Disposition: A | Discharge status: Other Acute Inpatient Hospital | Payer: Managed Care, Other (non HMO)

## 2021-06-04 DIAGNOSIS — R42 Dizziness and giddiness: Secondary | ICD-10-CM

## 2021-06-04 DIAGNOSIS — R079 Chest pain, unspecified: Secondary | ICD-10-CM | POA: Diagnosis not present

## 2021-06-04 DIAGNOSIS — I10 Essential (primary) hypertension: Secondary | ICD-10-CM

## 2021-06-04 DIAGNOSIS — R7981 Abnormal blood-gas level: Secondary | ICD-10-CM | POA: Diagnosis not present

## 2021-06-04 MED ORDER — ASPIRIN 81 MG PO CHEW
324.0000 mg | CHEWABLE_TABLET | Freq: Once | ORAL | Status: AC
  Administered 2021-06-04: 324 mg via ORAL

## 2021-06-04 NOTE — ED Notes (Signed)
Pt signed AMA as he declines EMS transport to the hospital. He states he will get his wife to take him [facility] when she gets off work.

## 2021-06-04 NOTE — Discharge Instructions (Addendum)
Due to left sided chest pain, dizziness, tingling in your left fingers and low oxygen saturation level, recommend go to the ER ASAP. We recommended that you go by ambulance but you have refused. Please have a family member take you to the ER now.

## 2021-06-04 NOTE — ED Triage Notes (Signed)
Patient presents to Urgent Care with complaints of intermittent left sided chest pain, SOB, dizziness, and left arm tingling since this morning about 0800. Pt states he quit taking his hydrochlorothiazide a year ago. He states his PCP is unaware that he stopped taking his hctz. 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 He was followed by [facility] and no abnormal findings at sept 2022 visit. Pt states he took cough med earlier today.

## 2021-06-04 NOTE — ED Provider Notes (Signed)
[facility]    CSN: [id number] Arrival date & time: 06/04/21  1601      History   Chief Complaint Chief Complaint  Patient presents with   Chest Pain   Dizziness    HPI Dylan Travis is a 61 y.o. male.   61 year old male presents with intermittent left sided chest pain that started around 8am this morning (8 hours ago). Pain continues to come and go but about 1 1/2 hours ago, he started having more left sided chest tightness and pressure. He left work and came here. He is also having some dizziness, left fingers tingling and slight blurred vision. Also slight cough but no nasal congestion or sore throat. He has some shortness of breath but not different from baseline. His oxygen saturation levels usually run 90-91% with no underlying cause. He has seen a Pulmonologist at [facility] and worked up for low oxygen level and given an Albuterol inhaler. No history of smoking. Does have history of HTN, hyperlipidemia, overweight, and gout. Patient uncertain what medications he is currently taking but he is taking "a blood pressure med" and Lipitor. He has Colchicine prn for gout. Mom and Father both has history of HTN. Mom has history of Diabetes.   The history is provided by the patient.   Past Medical History:  Diagnosis Date   Asthma    Gout    Hyperlipidemia    Hypertension     There are no problems to display for this patient.   Past Surgical History:  Procedure Laterality Date   HERNIA REPAIR         Home Medications    Prior to Admission medications   Medication Sig Start Date End Date Taking? Authorizing Provider  colchicine 0.6 MG tablet  11/05/20  Yes [provider]  albuterol (VENTOLIN HFA) 108 (90 Base) MCG/ACT inhaler Inhale 2 puffs into the lungs every 4 (four) hours as needed for wheezing or shortness of breath. 12/09/18 12/09/19  [provider]  allopurinol (ZYLOPRIM) 100 MG tablet Take 100 mg by mouth daily. 05/27/19   [provider]  atorvastatin (LIPITOR) 40 MG tablet Take 40 mg by mouth at bedtime. 08/04/19   [provider]  chlorthalidone (HYGROTON) 25 MG tablet Take 25 mg by mouth daily. 09/02/19   [provider]  metoprolol succinate (TOPROL-XL) 25 MG 24 hr tablet Take 25 mg by mouth daily. 08/29/19   [provider]    Family History Family History  Problem Relation Age of Onset   Hypertension Mother    Diabetes Mother    Cancer Father    Hypertension Father     Social History Social History   Tobacco Use   Smoking status: Never   Smokeless tobacco: Never  Vaping Use   Vaping Use: Never used  Substance Use Topics   Alcohol use: Yes    Alcohol/week: 8.0 standard drinks    Types: 6 Cans of beer, 2 Standard drinks or equivalent per week    Comment: weekends   Drug use: No     Allergies   Patient has no known allergies.   Review of Systems Review of Systems  Constitutional:  Positive for fatigue. Negative for activity change, appetite change, chills, diaphoresis and fever.  HENT:  Negative for congestion and trouble swallowing.   Eyes:  Positive for visual disturbance (more blurred vision). Negative for photophobia and pain.  Respiratory:  Positive for cough, chest tightness and shortness of breath. Negative for  wheezing.   Cardiovascular:  Positive for chest pain. Negative for palpitations.  Gastrointestinal:  Negative for nausea and vomiting.  Musculoskeletal:  Negative for back pain and neck pain.  Skin:  Negative for color change and rash.  Allergic/Immunologic: Negative for environmental allergies, food allergies and immunocompromised state.  Neurological:  Positive for dizziness, light-headedness and numbness (left fingers tingling). Negative for tremors, seizures, syncope, facial asymmetry, speech difficulty and weakness.  Hematological:  Negative for adenopathy. Does not bruise/bleed easily.    Physical Exam Triage Vital Signs ED Triage Vitals  Enc  Vitals Group     BP 06/04/21 1610 (!) 164/93     Pulse Rate 06/04/21 1610 77     Resp 06/04/21 1610 18     Temp --      Temp Source 06/04/21 1610 Oral     SpO2 06/04/21 1610 92 %     Weight --      Height --      Head Circumference --      Peak Flow --      Pain Score 06/04/21 1608 6     Pain Loc --      Pain Edu? --      Excl. in [place]? --    No data found.  Updated Vital Signs BP (!) 164/93 (BP Location: Right Arm)    Pulse 77    Resp 18    SpO2 92%   Visual Acuity Right Eye Distance:   Left Eye Distance:   Bilateral Distance:    Right Eye Near:   Left Eye Near:    Bilateral Near:     Physical Exam Vitals and nursing note reviewed.  Constitutional:      General: He is awake. He is not in acute distress.    Appearance: He is well-developed and overweight.     Comments: He is sitting on the exam table in no acute distress but appears tired. He is talking in complete sentences.   HENT:     Head: Normocephalic and atraumatic.     Right Ear: Hearing normal.     Left Ear: Hearing normal.     Nose: Nose normal.  Eyes:     General: Lids are normal.     Extraocular Movements: Extraocular movements intact.     Conjunctiva/sclera: Conjunctivae normal.     Pupils: Pupils are equal, round, and reactive to light.  Neck:     Vascular: No carotid bruit.  Cardiovascular:     Rate and Rhythm: Normal rate and regular rhythm.     Heart sounds: Normal heart sounds. No murmur heard. Pulmonary:     Effort: Pulmonary effort is normal. No respiratory distress.     Breath sounds: Normal breath sounds and air entry. No decreased air movement. No decreased breath sounds, wheezing, rhonchi or rales.  Musculoskeletal:        General: Normal range of motion.     Cervical back: Normal range of motion and neck supple.  Lymphadenopathy:     Cervical: No cervical adenopathy.  Skin:    General: Skin is warm and dry.     Capillary Refill: Capillary refill takes less than 2 seconds.      Findings: No rash.  Neurological:     General: No focal deficit present.     Mental Status: He is alert and oriented to person, place, and time.  Psychiatric:        Mood and Affect: Mood normal.  Behavior: Behavior normal. Behavior is cooperative.        Thought Content: Thought content normal.     UC Treatments / Results  Labs (all labs ordered are listed, but only abnormal results are displayed) Labs Reviewed - No data to display  EKG   Radiology No results found.  Procedures ED EKG  Date/Time: 06/04/2021 4:56 PM Performed by: Katy Apo, NP Authorized by: Chase Picket, MD   ECG reviewed by ED Physician in the absence of a cardiologist: no   Previous ECG:    Previous ECG:  Compared to current   Similarity:  Changes noted   Comparison ECG info:  Previous ECG from May 2022 showed PVC's. Interpretation:    Interpretation: non-specific   Rate:    ECG rate:  69   ECG rate assessment: normal   Rhythm:    Rhythm: sinus rhythm   ST segments:    ST segments:  Normal Comments:     No STEMi changes seen. Also reviewed by Tori Milks, NP (including critical care time)  Medications Ordered in UC Medications  aspirin chewable tablet 324 mg (324 mg Oral Given 06/04/21 1645)    Initial Impression / Assessment and Plan / UC Course  I have reviewed the triage vital signs and the nursing notes.  Pertinent labs & imaging results that were available during my care of the patient were reviewed by me and considered in my medical decision making (see chart for details).     Reviewed ECG preliminary results with patient- can not rule out Myocardial Infarction but no definitive changes indicating a STEMi.  Patient was given 324mg  of aspirin to help with pain.  He has multiple risk factors for cardiac cause of chest pain including age, elevated blood pressure, and hyperlipidemia. With accompanying low oxygen saturation level as well as finger  tingling/numbness, dizziness and vision changes, he needs further immediate evaluation at the ER. Recommend patient go to the ER ASAP. Although patient is stable, concern over worsening or changing of symptoms and recommend patient go by ambulance to ER. Patient refuses and indicates his wife will take him to either [facility] or [facility] ER now. Patient signed AMA for ambulance transport. Patient waited in exam room until wife came and took him by personal vehicle to the ER.   Final Clinical Impressions(s) / UC Diagnoses   Final diagnoses:  Left-sided chest pain  Dizziness  Elevated blood pressure reading with diagnosis of hypertension  Borderline low oxygen saturation level     Discharge Instructions      Due to left sided chest pain, dizziness, tingling in your left fingers and low oxygen saturation level, recommend go to the ER ASAP. We recommended that you go by ambulance but you have refused. Please have a family member take you to the ER now.     ED Prescriptions   None    PDMP not reviewed this encounter.   Katy Apo, NP 06/04/21 2325

## 2022-08-24 ENCOUNTER — Ambulatory Visit
Admission: EM | Admit: 2022-08-24 | Discharge: 2022-08-24 | Disposition: A | Discharge status: Home / Self Care | Payer: Managed Care, Other (non HMO) | Attending: Emergency Medicine | Admitting: Emergency Medicine

## 2022-08-24 ENCOUNTER — Encounter: Payer: Self-pay | Admitting: Emergency Medicine

## 2022-08-24 DIAGNOSIS — M109 Gout, unspecified: Secondary | ICD-10-CM

## 2022-08-24 MED ORDER — PREDNISONE 10 MG (21) PO TBPK: ORAL_TABLET | ORAL | 0 refills | Status: DC

## 2022-08-24 MED ORDER — DEXAMETHASONE SODIUM PHOSPHATE 10 MG/ML IJ SOLN
10.0000 mg | Freq: Once | INTRAMUSCULAR | Status: AC
  Administered 2022-08-24: 10 mg via INTRAMUSCULAR

## 2022-08-24 NOTE — Discharge Instructions (Addendum)
Take the prednisone according to the package instructions to help decrease inflammation and help with pain.  Follow the low purine diet guide given your discharge paperwork.  This is a list of foods that could be contributing to your gout attacks.  Red meat, alcohol, poor water intake, and leafy green vegetables are the most common contributors to gout flares.  Increase your oral fluid intake of water to at least 128 ounces to help your body flush the uric acid from your system.  Continue your allopurinol and discuss increasing your dose with your PCP since you are having breakthrough gout flares..  Return for reevaluation for any new or worsening symptoms.

## 2022-08-24 NOTE — ED Triage Notes (Signed)
Patient states that he is having a gout flare up in his right and left big toe, right ankle and right elbow that started on Monday.  Patient reports history of gout.

## 2022-08-24 NOTE — ED Provider Notes (Signed)
[facility]    CSN: [id number] Arrival date & time: 08/24/22  0801      History   Chief Complaint Chief Complaint  Patient presents with   Gout    HPI Dylan Travis is a 62 y.o. male.   HPI  62 year old male with past medical history significant for hypertension, hyperlipidemia, gout, and asthma presents for evaluation of 1 week worth of pain in both big toes, left ankle, and right elbow.  He states he is taking 200 mg of allopurinol for his gout currently but that it is not working.  He believes his trigger was eating barbecue 2 days in a row.  Past Medical History:  Diagnosis Date   Asthma    Gout    Hyperlipidemia    Hypertension     There are no problems to display for this patient.   Past Surgical History:  Procedure Laterality Date   HERNIA REPAIR         Home Medications    Prior to Admission medications   Medication Sig Start Date End Date Taking? Authorizing Provider  predniSONE (STERAPRED UNI-PAK 21 TAB) 10 MG (21) TBPK tablet Take 6 tablets on day 1, 5 tablets day 2, 4 tablets day 3, 3 tablets day 4, 2 tablets day 5, 1 tablet day 6 08/24/22  Yes Becky Augusta, NP  albuterol (VENTOLIN HFA) 108 (90 Base) MCG/ACT inhaler Inhale 2 puffs into the lungs every 4 (four) hours as needed for wheezing or shortness of breath. 12/09/18 12/09/19  [provider]  allopurinol (ZYLOPRIM) 100 MG tablet Take 100 mg by mouth daily. 05/27/19   [provider]  atorvastatin (LIPITOR) 40 MG tablet Take 40 mg by mouth at bedtime. 08/04/19   [provider]  chlorthalidone (HYGROTON) 25 MG tablet Take 25 mg by mouth daily. 09/02/19   [provider]  colchicine 0.6 MG tablet  11/05/20   [provider]  metoprolol succinate (TOPROL-XL) 25 MG 24 hr tablet Take 25 mg by mouth daily. 08/29/19   [provider]    Family History Family History  Problem Relation Age of Onset   Hypertension Mother    Diabetes Mother     Cancer Father    Hypertension Father     Social History Social History   Tobacco Use   Smoking status: Never   Smokeless tobacco: Never  Vaping Use   Vaping Use: Never used  Substance Use Topics   Alcohol use: Yes    Alcohol/week: 8.0 standard drinks of alcohol    Types: 6 Cans of beer, 2 Standard drinks or equivalent per week    Comment: weekends   Drug use: No     Allergies   Patient has no known allergies.   Review of Systems Review of Systems  Musculoskeletal:  Positive for arthralgias and joint swelling.  Skin:  Negative for color change.     Physical Exam Triage Vital Signs ED Triage Vitals  Enc Vitals Group     BP --      Pulse --      Resp --      Temp --      Temp src --      SpO2 --      Weight 08/24/22 0808 (!) 350 lb 1.5 oz (158.8 kg)     Height 08/24/22 0808 6\' 3"  (1.905 m)     Head Circumference --      Peak Flow --  Pain Score 08/24/22 0807 8     Pain Loc --      Pain Edu? --      Excl. in [place]? --    No data found.  Updated Vital Signs BP 118/71 (BP Location: Left Arm)   Pulse 100   Temp 98.4 F (36.9 C) (Oral)   Resp 16   Ht 6\' 3"  (1.905 m)   Wt (!) 350 lb 1.5 oz (158.8 kg)   SpO2 90%   BMI 43.76 kg/m   Visual Acuity Right Eye Distance:   Left Eye Distance:   Bilateral Distance:    Right Eye Near:   Left Eye Near:    Bilateral Near:     Physical Exam Vitals and nursing note reviewed.  Constitutional:      Appearance: Normal appearance. He is obese. He is not ill-appearing.  Musculoskeletal:        General: Tenderness present. No swelling. Normal range of motion.  Skin:    General: Skin is warm and dry.     Capillary Refill: Capillary refill takes less than 2 seconds.     Findings: No erythema.  Neurological:     Mental Status: He is alert.      UC Treatments / Results  Labs (all labs ordered are listed, but only abnormal results are displayed) Labs Reviewed - No data to display  EKG   Radiology No  results found.  Procedures Procedures (including critical care time)  Medications Ordered in UC Medications  dexamethasone (DECADRON) injection 10 mg (has no administration in time range)    Initial Impression / Assessment and Plan / UC Course  I have reviewed the triage vital signs and the nursing notes.  Pertinent labs & imaging results that were available during my care of the patient were reviewed by me and considered in my medical decision making (see chart for details).   Patient is a nontoxic-appearing 62 year old male presenting for evaluation of gout in multiple joints.  He states that these joints are typically where she does get gout.  His last gout flare was approximately 6 to 8 months ago.  He does believe that eating barbecue 2 days in a row is what triggered his gout symptoms.  He reports that he has missed 2 days of work secondary to his gout pain.  I will order 10 mg of IM Decadron to be administered in clinic and discharge patient home on a prednisone taper to start tomorrow morning.  I will give him a handout to review dietary restrictions I will also encourage him to increase his oral fluid intake so he helps to flush the uric acid from his system.  Return precautions reviewed.   Final Clinical Impressions(s) / UC Diagnoses   Final diagnoses:  Acute gout, unspecified cause, unspecified site     Discharge Instructions      Take the prednisone according to the package instructions to help decrease inflammation and help with pain.  Follow the low purine diet guide given your discharge paperwork.  This is a list of foods that could be contributing to your gout attacks.  Red meat, alcohol, poor water intake, and leafy green vegetables are the most common contributors to gout flares.  Increase your oral fluid intake of water to at least 128 ounces to help your body flush the uric acid from your system.  Continue your allopurinol and discuss increasing your dose with  your PCP since you are having breakthrough gout flares..  Return for  reevaluation for any new or worsening symptoms.     ED Prescriptions     Medication Sig Dispense Auth. Provider   predniSONE (STERAPRED UNI-PAK 21 TAB) 10 MG (21) TBPK tablet Take 6 tablets on day 1, 5 tablets day 2, 4 tablets day 3, 3 tablets day 4, 2 tablets day 5, 1 tablet day 6 21 tablet Becky Augusta, NP      PDMP not reviewed this encounter.   Becky Augusta, NP 08/24/22 [id number]

## 2022-11-10 ENCOUNTER — Encounter: Payer: Self-pay | Admitting: Emergency Medicine

## 2022-11-10 ENCOUNTER — Ambulatory Visit
Admission: EM | Admit: 2022-11-10 | Discharge: 2022-11-10 | Disposition: A | Discharge status: Home / Self Care | Payer: Self-pay | Attending: Emergency Medicine | Admitting: Emergency Medicine

## 2022-11-10 DIAGNOSIS — M109 Gout, unspecified: Secondary | ICD-10-CM

## 2022-11-10 MED ORDER — PREDNISONE 10 MG (21) PO TBPK: ORAL_TABLET | ORAL | 0 refills | Status: DC

## 2022-11-10 MED ORDER — DEXAMETHASONE SODIUM PHOSPHATE 10 MG/ML IJ SOLN
10.0000 mg | Freq: Once | INTRAMUSCULAR | Status: AC
  Administered 2022-11-10: 10 mg via INTRAMUSCULAR

## 2022-11-10 NOTE — ED Provider Notes (Signed)
[facility]    CSN: [id number] Arrival date & time: 11/10/22  1403      History   Chief Complaint Chief Complaint  Patient presents with   Hand Pain   Wrist Pain    HPI Dylan Travis is a 62 y.o. male.   HPI  62 year old male with a past medical history significant for hypertension, hyperlipidemia, asthma, and gout presents for evaluation of pain in his hand and wrist on the left side 1 week.  He typically takes allopurinol for his gout but he is also been out of it for the past week.  He endorses that he has had some alcohol as well as a a lot of fish which may have been his trigger.  He reports that it feels similar to his gout flare though his skin is not as tender as it usually is.  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  He was having trouble doing it secondary to pain so his job sent him home.  Past Medical History:  Diagnosis Date   Asthma    Gout    Hyperlipidemia    Hypertension     There are no problems to display for this patient.   Past Surgical History:  Procedure Laterality Date   HERNIA REPAIR         Home Medications    Prior to Admission medications   Medication Sig Start Date End Date Taking? Authorizing Provider  predniSONE (STERAPRED UNI-PAK 21 TAB) 10 MG (21) TBPK tablet Take 6 tablets on day 1, 5 tablets day 2, 4 tablets day 3, 3 tablets day 4, 2 tablets day 5, 1 tablet day 6 11/10/22  Yes Becky Augusta, NP  albuterol (VENTOLIN HFA) 108 (90 Base) MCG/ACT inhaler Inhale 2 puffs into the lungs every 4 (four) hours as needed for wheezing or shortness of breath. 12/09/18 12/09/19  [provider]  allopurinol (ZYLOPRIM) 100 MG tablet Take 100 mg by mouth daily. 05/27/19   [provider]  atorvastatin (LIPITOR) 40 MG tablet Take 40 mg by mouth at bedtime. 08/04/19   [provider]  chlorthalidone (HYGROTON) 25 MG tablet  Take 25 mg by mouth daily. 09/02/19   [provider]  colchicine 0.6 MG tablet  11/05/20   [provider]  metoprolol succinate (TOPROL-XL) 25 MG 24 hr tablet Take 25 mg by mouth daily. 08/29/19   [provider]    Family History Family History  Problem Relation Age of Onset   Hypertension Mother    Diabetes Mother    Cancer Father    Hypertension Father     Social History Social History   Tobacco Use   Smoking status: Never   Smokeless tobacco: Never  Vaping Use   Vaping status: Never Used  Substance Use Topics   Alcohol use: Yes    Alcohol/week: 8.0 standard drinks of alcohol    Types: 6 Cans of beer, 2 Standard drinks or equivalent per week    Comment: weekends   Drug use: No     Allergies   Patient has no known allergies.   Review of Systems Review of Systems  Constitutional:  Negative for fever.  Musculoskeletal:  Positive for arthralgias and joint swelling.  Skin:  Negative for color change.     Physical Exam Triage Vital Signs ED Triage Vitals  Encounter Vitals Group  BP      Systolic BP Percentile      Diastolic BP Percentile      Pulse      Resp      Temp      Temp src      SpO2      Weight      Height      Head Circumference      Peak Flow      Pain Score      Pain Loc      Pain Education      Exclude from Growth Chart    No data found.  Updated Vital Signs BP (!) 141/85 (BP Location: Left Arm)   Pulse 83   Temp 98.6 F (37 C) (Oral)   Resp 18   SpO2 90%   Visual Acuity Right Eye Distance:   Left Eye Distance:   Bilateral Distance:    Right Eye Near:   Left Eye Near:    Bilateral Near:     Physical Exam Vitals and nursing note reviewed.  Constitutional:      Appearance: Normal appearance. He is not ill-appearing.  HENT:     Head: Normocephalic and atraumatic.  Musculoskeletal:        General: Swelling and tenderness present. No deformity. Normal range of motion.  Skin:    General: Skin  is warm and dry.     Capillary Refill: Capillary refill takes less than 2 seconds.     Findings: No bruising or erythema.  Neurological:     General: No focal deficit present.     Mental Status: He is alert and oriented to person, place, and time.      UC Treatments / Results  Labs (all labs ordered are listed, but only abnormal results are displayed) Labs Reviewed - No data to display  EKG   Radiology No results found.  Procedures Procedures (including critical care time)  Medications Ordered in UC Medications  dexamethasone (DECADRON) injection 10 mg (has no administration in time range)    Initial Impression / Assessment and Plan / UC Course  I have reviewed the triage vital signs and the nursing notes.  Pertinent labs & imaging results that were available during my care of the patient were reviewed by me and considered in my medical decision making (see chart for details).   Patient is a pleasant, nontoxic-appearing 62 year old male presenting for evaluation of pain and swelling to left hand and wrist x 1 week as outlined in HPI above.  As mentioned above, the patient has a history of gout and he typically takes allopurinol but he has been out for the past week.  In addition to not taking his allopurinol he is also been consuming a lot of fish and also has had some alcohol to drink.  He denies any red meat consumption or any other known triggers.  On exam he does have swelling of the left wrist and proximal left hand but is nontender to palpation.  There is also no erythema or warmth.  I suspect that this is a gout flare brought on by the above listed factors.  I will treat him with an injection of Decadron here in clinic and start him on a prednisone taper tomorrow morning.  He reports that his doctor did call in a refill of the allopurinol and I have cautioned him to wait until after he is finished prednisone to start taking the allopurinol again.  Return precautions reviewed.  Work note provided.   Final Clinical Impressions(s) / UC Diagnoses   Final diagnoses:  Acute gout of left wrist, unspecified cause     Discharge Instructions      Take the prednisone according to the package instructions to help decrease inflammation and help with pain.  Follow the low purine diet guide given your discharge paperwork.  This is a list of foods that could be contributing to your gout attacks.  Red meat, alcohol, poor water intake, and leafy green vegetables are the most common contributors to gout flares.  Increase your oral fluid intake of water to at least 128 ounces to help your body flush the uric acid from your system.  Start taking the allopurinol after you have finished the prednisone for your gout attack.  We have help to establish a primary care at this visit.  Please keep the appointment so that you have someone going forward who can help you manage your gout in the future.      ED Prescriptions     Medication Sig Dispense Auth. Provider   predniSONE (STERAPRED UNI-PAK 21 TAB) 10 MG (21) TBPK tablet Take 6 tablets on day 1, 5 tablets day 2, 4 tablets day 3, 3 tablets day 4, 2 tablets day 5, 1 tablet day 6 21 tablet Becky Augusta, NP      PDMP not reviewed this encounter.   Becky Augusta, NP 11/10/22 1430

## 2022-11-10 NOTE — ED Triage Notes (Signed)
Pt presents with left wrist and hand pain x 1 week.

## 2022-11-10 NOTE — Discharge Instructions (Addendum)
Take the prednisone according to the package instructions to help decrease inflammation and help with pain.  Follow the low purine diet guide given your discharge paperwork.  This is a list of foods that could be contributing to your gout attacks.  Red meat, alcohol, poor water intake, and leafy green vegetables are the most common contributors to gout flares.  Increase your oral fluid intake of water to at least 128 ounces to help your body flush the uric acid from your system.  Start taking the allopurinol after you have finished the prednisone for your gout attack.  We have help to establish a primary care at this visit.  Please keep the appointment so that you have someone going forward who can help you manage your gout in the future.

## 2022-11-21 ENCOUNTER — Encounter: Payer: Self-pay | Admitting: Emergency Medicine

## 2022-11-21 ENCOUNTER — Ambulatory Visit
Admission: EM | Admit: 2022-11-21 | Discharge: 2022-11-21 | Disposition: A | Discharge status: Home / Self Care | Payer: Self-pay

## 2022-11-21 DIAGNOSIS — M25532 Pain in left wrist: Secondary | ICD-10-CM

## 2022-11-21 DIAGNOSIS — N1831 Chronic kidney disease, stage 3a: Secondary | ICD-10-CM

## 2022-11-21 DIAGNOSIS — Z8739 Personal history of other diseases of the musculoskeletal system and connective tissue: Secondary | ICD-10-CM

## 2022-11-21 MED ORDER — DEXAMETHASONE SODIUM PHOSPHATE 10 MG/ML IJ SOLN
10.0000 mg | Freq: Once | INTRAMUSCULAR | Status: AC
  Administered 2022-11-21: 10 mg via INTRAMUSCULAR

## 2022-11-21 MED ORDER — PREDNISONE 10 MG PO TABS: ORAL_TABLET | ORAL | 0 refills | Status: DC

## 2022-11-21 NOTE — Discharge Instructions (Addendum)
-  I am not sure if your left wrist pain is due to continued gout flareup or some other underlying inflammatory cause.  Since you do have a history of chronic kidney disease (if you have more questions about this please return to your PCP) we will need to avoid NSAIDs such as naproxen, Aleve, ibuprofen, Motrin.  Avoid these medications in the future as well.  You have been given an injection of a corticosteroid -Another corticosteroid taper sent to the pharmacy but if you finish this medication you still have symptoms you should follow-up with orthopedics to see if you need further testing, injection into the wrist, physical therapy, etc. -Ice and elevate your wrist.  You can also use Voltaren gel and take Tylenol.  Avoid painful activities. -Continue taking allopurinol and avoid triggers such as alcohol, red meat, seafood.   You have a condition requiring you to follow up with Orthopedics so please call one of the following office for appointment:   [facility] Address: [address], [place], [place] [address] Phone: [phone]  [facility] [address] [place], [place] [address] Phone: [phone]  [facility] [address] [place], [place] [address] Phone: [phone]

## 2022-11-21 NOTE — ED Triage Notes (Signed)
Patient c/o left wrist pain that he was seen for on 11/10/22 and was treated for Gout.  Patient states that he is still having pain in his left wrist.

## 2022-11-21 NOTE — ED Provider Notes (Signed)
[facility]    CSN: [id number] Arrival date & time: 11/21/22  3474      History   Chief Complaint Chief Complaint  Patient presents with   Wrist Pain    left    HPI Dylan Travis is a 62 y.o. male with history of asthma, gout, hypertension, hyperlipidemia and obesity.  He presents today for continued left wrist pain and swelling.  He was initially seen in the emergency department 10/12/2022 for this atraumatic discomfort.  He had an x-ray performed which only showed swelling.  He was treated with prednisone for 5 days and says symptoms got better.  Symptoms returned a week and a half ago.  He was seen in this department and treated with corticosteroids for about a week.  He reports symptoms got better right away and worsened over the past couple of days after completing the prednisone.  He has been taking Tylenol at home without relief.  He is right-handed.  Patient has been back on allopurinol for the past 11 days.  No other complaints.  HPI  Past Medical History:  Diagnosis Date   Asthma    Gout    Hyperlipidemia    Hypertension     There are no problems to display for this patient.   Past Surgical History:  Procedure Laterality Date   HERNIA REPAIR         Home Medications    Prior to Admission medications   Medication Sig Start Date End Date Taking? Authorizing Provider  losartan (COZAAR) 50 MG tablet Take 50 mg by mouth daily.   Yes [provider]  predniSONE (DELTASONE) 10 MG tablet Take 5 tabs po x 2 days, 4 tab x 2 days, 3 tab x 2 days, 2 tab x 2 days, 1 tab x 2 days. 11/21/22  Yes Shirlee Latch, PA-C  albuterol (VENTOLIN HFA) 108 (90 Base) MCG/ACT inhaler Inhale 2 puffs into the lungs every 4 (four) hours as needed for wheezing or shortness of breath. 12/09/18 12/09/19  [provider]  allopurinol (ZYLOPRIM) 100 MG tablet Take 100 mg by mouth daily. 05/27/19   [provider]  atorvastatin (LIPITOR) 40 MG tablet Take 40 mg by  mouth at bedtime. 08/04/19   [provider]  colchicine 0.6 MG tablet  11/05/20   [provider]  metoprolol succinate (TOPROL-XL) 25 MG 24 hr tablet Take 25 mg by mouth daily. 08/29/19   [provider]    Family History Family History  Problem Relation Age of Onset   Hypertension Mother    Diabetes Mother    Cancer Father    Hypertension Father     Social History Social History   Tobacco Use   Smoking status: Never   Smokeless tobacco: Never  Vaping Use   Vaping status: Never Used  Substance Use Topics   Alcohol use: Yes    Alcohol/week: 8.0 standard drinks of alcohol    Types: 6 Cans of beer, 2 Standard drinks or equivalent per week    Comment: weekends   Drug use: No     Allergies   Patient has no known allergies.   Review of Systems Review of Systems  Musculoskeletal:  Positive for arthralgias and joint swelling.  Skin:  Negative for color change and wound.  Neurological:  Negative for weakness and numbness.     Physical Exam Triage Vital Signs ED Triage Vitals  Encounter Vitals Group     BP 11/21/22 0836 (!) 152/92  Systolic BP Percentile --      Diastolic BP Percentile --      Pulse Rate 11/21/22 0836 84     Resp 11/21/22 0836 16     Temp 11/21/22 0836 98.2 F (36.8 C)     Temp Source 11/21/22 0836 Oral     SpO2 11/21/22 0836 92 %     Weight 11/21/22 0836 (!) 350 lb 1.5 oz (158.8 kg)     Height 11/21/22 0836 6\' 3"  (1.905 m)     Head Circumference --      Peak Flow --      Pain Score 11/21/22 0835 8     Pain Loc --      Pain Education --      Exclude from Growth Chart --    No data found.  Updated Vital Signs BP (!) 152/92 (BP Location: Right Arm)   Pulse 84   Temp 98.2 F (36.8 C) (Oral)   Resp 16   Ht 6\' 3"  (1.905 m)   Wt (!) 350 lb 1.5 oz (158.8 kg)   SpO2 92%   BMI 43.76 kg/m       Physical Exam Vitals and nursing note reviewed.  Constitutional:      General: He is not in acute distress.     Appearance: Normal appearance. He is well-developed.  HENT:     Head: Normocephalic and atraumatic.  Eyes:     General: No scleral icterus.    Conjunctiva/sclera: Conjunctivae normal.  Cardiovascular:     Rate and Rhythm: Normal rate and regular rhythm.  Pulmonary:     Effort: Pulmonary effort is normal. No respiratory distress.  Musculoskeletal:     Left wrist: Swelling (moderate swelling dorsal wrist without erythema, mild warmth.) and tenderness (distal ulna and DRUJ) present. No deformity. Decreased range of motion. Normal pulse.     Cervical back: Neck supple.  Skin:    General: Skin is warm and dry.     Capillary Refill: Capillary refill takes less than 2 seconds.  Neurological:     General: No focal deficit present.     Mental Status: He is alert. Mental status is at baseline.     Motor: No weakness.     Gait: Gait normal.  Psychiatric:        Mood and Affect: Mood normal.        Behavior: Behavior normal.      UC Treatments / Results  Labs (all labs ordered are listed, but only abnormal results are displayed) Labs Reviewed - No data to display  EKG   Radiology No results found.  Imaging Results - XR Wrist 3 Or More Views Left (10/12/2022 7:52 PM EDT) Narrative  10/12/2022 8:47 PM EDT  EXAM: XR WRIST 3 OR MORE VIEWS LEFT DATE: 10/12/2022 7:52 PM ACCESSION: [id number] UN DICTATED: 10/12/2022 8:14 PM INTERPRETATION LOCATION: [facility]  CLINICAL INDICATION: 61 years old Male with Trauma/Injury     COMPARISON: MRI left wrist 01/30/2014, wrist radiographs 01/25/2014  TECHNIQUE: PA, lateral, and oblique views of the left wrist.  FINDINGS: Diffuse osseous demineralization. No acute fracture or dislocation. The visualized joints appear in normal alignment with preserved joint spaces. There is diffuse soft tissue swelling of the wrist.      Procedures Procedures (including critical care time)  Medications Ordered in UC Medications  dexamethasone (DECADRON)  injection 10 mg (has no administration in time range)    Initial Impression / Assessment and Plan / UC Course  I have  reviewed the triage vital signs and the nursing notes.  Pertinent labs & imaging results that were available during my care of the patient were reviewed by me and considered in my medical decision making (see chart for details).   62 year old male with history of obesity, hypertension, hyperlipidemia, gout and CKD stage III presents for continued left wrist pain and swelling.  Initial symptoms started 5 weeks ago when he went to the emergency department.  Has been on prednisone twice.  Completed prednisone 4 days ago.  Reported improvement while on prednisone.  Restarted allopurinol on 8/5.  Denies injury.  X-ray from ED visit on 7/7 included in chart.  On exam he does have moderate swelling of the left dorsal wrist.  Tenderness of the distal ulna and DRUJ.  Reduced range of motion.  Good pulses.  Possible gout flareup versus tendinitis or other underlying condition.  Patient should avoid NSAIDs with kidney issues.  Advised him not to take any further NSAIDs including naproxen.  Patient given dexamethasone injection in clinic.  Start prednisone taper tomorrow for 10 days, doing a longer taper.  Advised if no improvement or return of symptoms after prednisone this time then he should follow-up with orthopedics and patient given information for orthopedics.  Reviewed ice guidelines, Tylenol for pain.  Advised avoiding triggers.  We did also discuss how gout occurs in his is likely in part due to his reduced kidney function.  Advised follow-up with PCP.   Final Clinical Impressions(s) / UC Diagnoses   Final diagnoses:  Acute pain of left wrist  History of gout  Stage 3a chronic kidney disease (HCC)     Discharge Instructions      -I am not sure if your left wrist pain is due to continued gout flareup or some other underlying inflammatory cause.  Since you do have a history of  chronic kidney disease (if you have more questions about this please return to your PCP) we will need to avoid NSAIDs such as naproxen, Aleve, ibuprofen, Motrin.  Avoid these medications in the future as well.  You have been given an injection of a corticosteroid -Another corticosteroid taper sent to the pharmacy but if you finish this medication you still have symptoms you should follow-up with orthopedics to see if you need further testing, injection into the wrist, physical therapy, etc. -Ice and elevate your wrist.  You can also use Voltaren gel and take Tylenol.  Avoid painful activities. -Continue taking allopurinol and avoid triggers such as alcohol, red meat, seafood.   You have a condition requiring you to follow up with Orthopedics so please call one of the following office for appointment:   [facility] Address: [address], [place], [place] [address] Phone: [phone]  [facility] [address], [place], [place] [address] Phone: [phone]  [facility] [address], [place], [place] [address] Phone: [phone]      ED Prescriptions     Medication Sig Dispense Auth. Provider   predniSONE (DELTASONE) 10 MG tablet Take 5 tabs po x 2 days, 4 tab x 2 days, 3 tab x 2 days, 2 tab x 2 days, 1 tab x 2 days. 30 tablet Gareth Morgan      PDMP not reviewed this encounter.   Shirlee Latch, PA-C 11/21/22 [phone]

## 2022-12-11 ENCOUNTER — Ambulatory Visit
Admission: EM | Admit: 2022-12-11 | Discharge: 2022-12-11 | Disposition: A | Discharge status: Home / Self Care | Payer: Self-pay | Attending: Emergency Medicine | Admitting: Emergency Medicine

## 2022-12-11 DIAGNOSIS — M109 Gout, unspecified: Secondary | ICD-10-CM

## 2022-12-11 MED ORDER — DEXAMETHASONE SODIUM PHOSPHATE 10 MG/ML IJ SOLN
10.0000 mg | Freq: Once | INTRAMUSCULAR | Status: AC
  Administered 2022-12-11: 10 mg via INTRAMUSCULAR

## 2022-12-11 MED ORDER — PREDNISONE 10 MG PO TABS: ORAL_TABLET | ORAL | 0 refills | Status: DC

## 2022-12-11 MED ORDER — COLCHICINE 0.6 MG PO TABS: ORAL_TABLET | ORAL | 0 refills | Status: AC

## 2022-12-11 NOTE — ED Triage Notes (Signed)
Pt states that his oxygen has been running low from 80-91 and he is about to do a sleep apnea test.

## 2022-12-11 NOTE — Discharge Instructions (Addendum)
The prednisone will help with the pain.  Given you a shot of dexamethasone here today.  You can start the prednisone tomorrow.  I am also starting you on a short dose of colchicine which will treat this acute flare.  Stop the allopurinol during acute flare.  Do not drink grapefruit juice while taking the colchicine.  No NSAIDs such as ibuprofen or Aleve due to your kidney function.  I suspect the recurrent gout flares are from your kidneys.  Follow-up with your primary care provider to discuss this.  You may need to be referred to a nephrologist.  You can also follow-up with [facility] since this is a recurrent issue.

## 2022-12-11 NOTE — ED Provider Notes (Signed)
HPI  SUBJECTIVE:  Dylan Travis is a right-handed 62 y.o. male who presents with 2 days of constant pain described as soreness and swelling in his left wrist/hand with hypersensitivity.  No erythema, increased temperature.  No trauma to the wrist.  No change in his physical activity.  No fevers, numbness or tingling in the hand, grip weakness.  He states this is identical to the 2 gout flares he had last month that responded to steroids.  He states that he is currently taking allopurinol.  He is not on colchicine.  He denies dietary indiscretions, but states that he has been drinking alcohol.  No antipyretic in the past 6 hours.  No alleviating factors.  He has not tried anything for his symptoms.  Symptoms are worse with movement, palpation.  he has a past medical history of hypertension, hyperlipidemia, gout, chronic kidney disease stage III, asthma.  He has been on allopurinol in the past.  He was sent home on Augmentin on 9/1 for sinus fracture.  Patient has been seen here 3 times in the past 4 months for gout flares.  First time it was in both big toes, left ankle and right elbow, second time in his left wrist on 8/5.  He was treated with Decadron and started on a prednisone taper.  He was again seen on 8/16 for the same after improving temporarily with prednisone.  Wrist x-ray was unremarkable.  He was given another dexamethasone injection and sent home with 10 days of prednisone, advised to follow-up with orthopedics.  He states this resolved his symptoms.  PCP: Bernestine Amass.  He has a appointment with them on Wednesday.    Past Medical History:  Diagnosis Date   Asthma    Gout    Hyperlipidemia    Hypertension     Past Surgical History:  Procedure Laterality Date   HERNIA REPAIR      Family History  Problem Relation Age of Onset   Hypertension Mother    Diabetes Mother    Cancer Father    Hypertension Father     Social History   Tobacco Use   Smoking status: Never    Smokeless tobacco: Never  Vaping Use   Vaping status: Never Used  Substance Use Topics   Alcohol use: Yes    Alcohol/week: 8.0 standard drinks of alcohol    Types: 6 Cans of beer, 2 Standard drinks or equivalent per week    Comment: weekends   Drug use: No     Current Facility-Administered Medications:    dexamethasone (DECADRON) injection 10 mg, 10 mg, Intramuscular, Once, Domenick Gong, MD  Current Outpatient Medications:    allopurinol (ZYLOPRIM) 100 MG tablet, Take 100 mg by mouth daily., Disp: , Rfl:    atorvastatin (LIPITOR) 40 MG tablet, Take 40 mg by mouth at bedtime., Disp: , Rfl:    colchicine 0.6 MG tablet, 2 tabs po x 1, then one tab po 1 hour later.  1 tablet 1 time a day for 4 more days or until flare resolves., Disp: 6 tablet, Rfl: 0   losartan (COZAAR) 50 MG tablet, Take 50 mg by mouth daily., Disp: , Rfl:    metoprolol succinate (TOPROL-XL) 25 MG 24 hr tablet, Take 25 mg by mouth daily., Disp: , Rfl:    albuterol (VENTOLIN HFA) 108 (90 Base) MCG/ACT inhaler, Inhale 2 puffs into the lungs every 4 (four) hours as needed for wheezing or shortness of breath., Disp: , Rfl:    predniSONE (DELTASONE)  10 MG tablet, Take 5 tabs po x 2 days, 4 tab x 2 days, 3 tab x 2 days, 2 tab x 2 days, 1 tab x 2 days., Disp: 30 tablet, Rfl: 0  No Known Allergies   ROS  As noted in HPI.   Physical Exam  BP (!) 159/89 (BP Location: Right Wrist)   Pulse 82   Temp 98.9 F (37.2 C) (Oral)   Ht 6\' 4"  (1.93 m)   Wt (!) 166.9 kg   SpO2 92%   BMI 44.79 kg/m   Constitutional: Well developed, well nourished, no acute distress Eyes:  EOMI, conjunctiva normal bilaterally HENT: Normocephalic, atraumatic,mucus membranes moist Respiratory: Normal inspiratory effort Cardiovascular: Normal rate GI: nondistended skin: No rash, skin intact Musculoskeletal: Diffuse tenderness, swelling left wrist and hand.  Pain with all range of motion.  No tenderness over the scaphoid.  RP 2+.  Sensation  and motor in the median/radial/ulnar distribution intact.  No erythema, increased temperature.     Neurologic: Alert & oriented x 3, no focal neuro deficits Psychiatric: Speech and behavior appropriate   ED Course   Medications  dexamethasone (DECADRON) injection 10 mg (has no administration in time range)    No orders of the defined types were placed in this encounter.   No results found for this or any previous visit (from the past 24 hour(s)). No results found.  ED Clinical Impression  1. Acute gout of left wrist, unspecified cause      ED Assessment/Plan     Previous records reviewed.  Additional medical history obtained.    EGFR from labs done on 9/1 37 mL/min.   Calculated creatinine clearance 92 mL/min.  No dose adjustments required per up-to-date.  Will send home with 1.2 mg then 0.6 mg 1 hour later today.  0.6 mg daily for the next 4 days.  Suspect that patient's recurrent gout flares are due to chronic kidney disease.  He denies dietary indiscretions.  Will give shot of dexamethasone 10 mg IM x 1 as he responded well to this in the past, another 10-day prednisone taper, and start him on a short course of low-dose colchicine.  Advised him no NSAIDs and to discontinue the allopurinol during active flare.  He has follow-up with his PCP next week, advised him to discuss recurrent gout/chronic kidney disease with them.  He may need to see a nephrologist.  Or, he can follow-up with [facility].  Discussed labs, MDM, treatment plan, and plan for follow-up with patient. Discussed sn/sx that should prompt return to the ED. patient agrees with plan.   Meds ordered this encounter  Medications   dexamethasone (DECADRON) injection 10 mg   predniSONE (DELTASONE) 10 MG tablet    Sig: Take 5 tabs po x 2 days, 4 tab x 2 days, 3 tab x 2 days, 2 tab x 2 days, 1 tab x 2 days.    Dispense:  30 tablet    Refill:  0   colchicine 0.6 MG tablet    Sig: 2 tabs po x 1, then one tab  po 1 hour later.  1 tablet 1 time a day for 4 more days or until flare resolves.    Dispense:  6 tablet    Refill:  0      *This clinic note was created using Scientist, clinical (histocompatibility and immunogenetics). Therefore, there may be occasional mistakes despite careful proofreading.  ?    Domenick Gong, MD 12/12/22 1116

## 2022-12-11 NOTE — ED Triage Notes (Signed)
Pt c/o gout flare up in left wrist x2days  Pt states that he has been having gout flare ups in the wrist, foot, and elbow.   Pt states that he does not take colchicine or allopurinol  Pt states that he is taking amoxicillin for a facial injury. Pt states that he fell last weekend and busted his lip and nose.

## 2023-01-02 ENCOUNTER — Encounter: Payer: Self-pay | Admitting: Emergency Medicine

## 2023-01-02 ENCOUNTER — Ambulatory Visit
Admission: EM | Admit: 2023-01-02 | Discharge: 2023-01-02 | Disposition: A | Discharge status: Home / Self Care | Payer: Self-pay | Attending: Physician Assistant | Admitting: Physician Assistant

## 2023-01-02 ENCOUNTER — Telehealth: Payer: Self-pay

## 2023-01-02 DIAGNOSIS — M109 Gout, unspecified: Secondary | ICD-10-CM

## 2023-01-02 DIAGNOSIS — M25562 Pain in left knee: Secondary | ICD-10-CM

## 2023-01-02 MED ORDER — PREDNISONE 10 MG PO TABS: ORAL_TABLET | ORAL | 0 refills | Status: AC

## 2023-01-02 MED ORDER — HYDROCODONE-ACETAMINOPHEN 7.5-325 MG PO TABS: 1.0000 | ORAL_TABLET | Freq: Four times a day (QID) | ORAL | 0 refills | Status: DC | PRN

## 2023-01-02 MED ORDER — DEXAMETHASONE SODIUM PHOSPHATE 10 MG/ML IJ SOLN
10.0000 mg | Freq: Once | INTRAMUSCULAR | Status: AC
  Administered 2023-01-02: 10 mg via INTRAMUSCULAR

## 2023-01-02 MED ORDER — PREDNISONE 10 MG PO TABS: ORAL_TABLET | ORAL | 0 refills | Status: DC

## 2023-01-02 MED ORDER — HYDROCODONE-ACETAMINOPHEN 7.5-325 MG PO TABS: 1.0000 | ORAL_TABLET | Freq: Four times a day (QID) | ORAL | 0 refills | Status: AC | PRN

## 2023-01-02 NOTE — ED Triage Notes (Signed)
Patient c/o left knee pain that started on Tuesday.  Patient states that since then the pain worsen and is having swelling in his left knee and down his left lower leg.  Patient denies injury or fall.

## 2023-01-02 NOTE — Telephone Encounter (Signed)
Pharmacy called and stated that they dont carry the 7.5 hydrocodone. Patient asked for it to be sent to [facility] in [place]. Brooke made aware.

## 2023-01-02 NOTE — ED Provider Notes (Signed)
[facility]    CSN: [id number] Arrival date & time: 01/02/23  0813      History   Chief Complaint Chief Complaint  Patient presents with   Knee Pain    left    HPI Dylan Travis is a 62 y.o. male with a history of gout, hypertension, asthma, and obesity who presents for 3 days of atraumatic left knee pain and swelling. Patient has knee effusion.  Swelling into his lower leg but denies any pain in calf.  Does not have surrounding erythema and does not have systemic symptoms.  No history of DVT or PE.  Of note, patient's oxygen was initially 86% checked by nursing staff.  Recheck was 90 to 91%.  He has a history of having low oxygen saturations at numerous previous visits and it was 92% in the ED yesterday.  Patient was seen in the ED yesterday and had CBC, BMP, joint fluid analysis, CRP, and ESR tests.  He was prescribed colchicine and indomethacin yesterday, but has not picked up the medications. He does report some mild relief since having fluid drawn off the knee yesterday.  He says he has an appointment with orthopedics next week.  Of note, patient has had lab work over the past year which shows GFR less than 60.  HPI  Past Medical History:  Diagnosis Date   Asthma    Gout    Hyperlipidemia    Hypertension     There are no problems to display for this patient.   Past Surgical History:  Procedure Laterality Date   HERNIA REPAIR         Home Medications    Prior to Admission medications   Medication Sig Start Date End Date Taking? Authorizing Provider  HYDROcodone-acetaminophen (NORCO) 7.5-325 MG tablet Take 1 tablet by mouth every 6 (six) hours as needed for up to 3 days for severe pain. 01/02/23 01/05/23 Yes Shirlee Latch, PA-C  albuterol (VENTOLIN HFA) 108 (90 Base) MCG/ACT inhaler Inhale 2 puffs into the lungs every 4 (four) hours as needed for wheezing or shortness of breath. 12/09/18 12/09/19  [provider]  allopurinol (ZYLOPRIM) 100 MG  tablet Take 100 mg by mouth daily. 05/27/19   [provider]  atorvastatin (LIPITOR) 40 MG tablet Take 40 mg by mouth at bedtime. 08/04/19   [provider]  colchicine 0.6 MG tablet 2 tabs po x 1, then one tab po 1 hour later.  1 tablet 1 time a day for 4 more days or until flare resolves. 12/11/22   Domenick Gong, MD  losartan (COZAAR) 50 MG tablet Take 50 mg by mouth daily.    [provider]  metoprolol succinate (TOPROL-XL) 25 MG 24 hr tablet Take 25 mg by mouth daily. 08/29/19   [provider]  predniSONE (DELTASONE) 10 MG tablet Take 5 tabs po x 2 days, 4 tab x 2 days, 3 tab x 2 days, 2 tab x 2 days, 1 tab x 2 days. 01/02/23   Shirlee Latch, PA-C    Family History Family History  Problem Relation Age of Onset   Hypertension Mother    Diabetes Mother    Cancer Father    Hypertension Father     Social History Social History   Tobacco Use   Smoking status: Never   Smokeless tobacco: Never  Vaping Use   Vaping status: Never Used  Substance Use Topics   Alcohol use: Yes    Alcohol/week: 8.0 standard drinks  of alcohol    Types: 6 Cans of beer, 2 Standard drinks or equivalent per week    Comment: weekends   Drug use: No     Allergies   Patient has no known allergies.   Review of Systems Review of Systems  Constitutional:  Negative for chills, fatigue and fever.  Respiratory:  Negative for chest tightness and shortness of breath.   Cardiovascular:  Negative for chest pain and palpitations.  Musculoskeletal:  Positive for arthralgias, gait problem and joint swelling.  Skin:  Negative for color change and wound.  Neurological:  Negative for weakness and numbness.     Physical Exam Triage Vital Signs ED Triage Vitals  Encounter Vitals Group     BP      Systolic BP Percentile      Diastolic BP Percentile      Pulse      Resp      Temp      Temp src      SpO2      Weight      Height      Head Circumference      Peak Flow       Pain Score      Pain Loc      Pain Education      Exclude from Growth Chart    No data found.  Updated Vital Signs BP 111/69 (BP Location: Left Arm)   Pulse 88   Temp 98.7 F (37.1 C) (Oral)   Resp 16   Ht 6\' 4"  (1.93 m)   Wt (!) 367 lb 15.2 oz (166.9 kg)   SpO2 91%   BMI 44.79 kg/m      Physical Exam Vitals and nursing note reviewed.  Constitutional:      General: He is not in acute distress.    Appearance: Normal appearance. He is well-developed. He is obese. He is not ill-appearing.  HENT:     Head: Normocephalic and atraumatic.  Eyes:     General: No scleral icterus.    Conjunctiva/sclera: Conjunctivae normal.  Cardiovascular:     Rate and Rhythm: Normal rate and regular rhythm.     Pulses: Normal pulses.     Heart sounds: Normal heart sounds.  Pulmonary:     Effort: Pulmonary effort is normal. No respiratory distress.     Breath sounds: Normal breath sounds. No wheezing, rhonchi or rales.  Musculoskeletal:     Cervical back: Neck supple.     Left knee: Swelling and effusion (mild) present. No erythema (no erythema but there is increased warmth compared to opposite knee). Decreased range of motion (90% flexion). Tenderness (medial and lateral joint lines) present. Normal pulse.     Comments: Increased edema of left lower leg, but no calf tenderness  Skin:    General: Skin is warm and dry.     Capillary Refill: Capillary refill takes less than 2 seconds.  Neurological:     General: No focal deficit present.     Mental Status: He is alert. Mental status is at baseline.     Gait: Gait abnormal.  Psychiatric:        Mood and Affect: Mood normal.        Behavior: Behavior normal.      UC Treatments / Results  Labs (all labs ordered are listed, but only abnormal results are displayed) Labs Reviewed - No data to display  Plan of Treatment - Pending Results Pending Results Name Type Priority Associated  Diagnoses Date/Time  Joint Fluid Culture  Microbiology STAT   01/01/2023 7:24 AM EDT     (ABNORMAL) CBC w/ Differential (01/01/2023 7:30 AM EDT) Lab Results - (ABNORMAL) CBC w/ Differential (01/01/2023 7:30 AM EDT) Component Value Ref Range Test Method Analysis Time Performed At Pathologist Signature  WBC 11.9 (H) 3.6 - 11.2 10*9/L   01/01/2023 7:36 AM EDT [facility]    RBC 4.10 (L) 4.26 - 5.60 10*12/L   01/01/2023 7:36 AM EDT [facility]    HGB 12.6 (L) 12.9 - 16.5 g/dL   30/86/5784 6:96 AM EDT [facility]    HCT 38.7 (L) 39.0 - 48.0 %   01/01/2023 7:36 AM EDT [facility]    MCV 94.5 77.6 - 95.7 fL   01/01/2023 7:36 AM EDT [facility]    MCH 30.7 25.9 - 32.4 pg   01/01/2023 7:36 AM EDT [facility]    MCHC 32.5 32.0 - 36.0 g/dL   29/52/8413 2:44 AM EDT [facility]    RDW 14.8 12.2 - 15.2 %   01/01/2023 7:36 AM EDT [facility]    MPV 7.2 6.8 - 10.7 fL   01/01/2023 7:36 AM EDT [facility]    Platelet 153 150 - 450 10*9/L   01/01/2023 7:36 AM EDT [facility]    nRBC 0 <=4 /100 WBCs   01/01/2023 7:36 AM EDT [facility]    Neutrophils % 78.9 %   01/01/2023 7:36 AM EDT [facility]    Lymphocytes % 10.9 %   01/01/2023 7:36 AM EDT [facility]    Monocytes % 9.5 %   01/01/2023 7:36 AM EDT [facility]    Eosinophils % 0.1 %   01/01/2023 7:36 AM EDT [facility]    Basophils % 0.6 %   01/01/2023 7:36 AM EDT [facility]    Absolute Neutrophils 9.3 (H) 1.8 - 7.8 10*9/L   01/01/2023 7:36 AM EDT [facility]    Absolute Lymphocytes 1.3 1.1 - 3.6 10*9/L   01/01/2023 7:36 AM EDT [facility]    Absolute Monocytes 1.1 (H) 0.3 - 0.8 10*9/L   01/01/2023 7:36 AM EDT [facility]    Absolute Eosinophils 0.0 0.0 - 0.5 10*9/L   01/01/2023  7:36 AM EDT [facility]    Absolute Basophils 0.1 0.0 - 0.1 10*9/L   01/01/2023 7:36 AM EDT [facility]     Lab Results - (ABNORMAL) CBC w/ Differential (01/01/2023 7:30 AM EDT) Specimen (Source) Anatomical Location / Laterality Collection Method / Volume Collection Time Received Time  Blood   Venipuncture / Unknown 01/01/2023 7:30 AM EDT 01/01/2023 7:33 AM EDT    Back to top of Lab Results    (ABNORMAL) C-reactive protein (01/01/2023 7:30 AM EDT) Lab Results - (ABNORMAL) C-reactive protein (01/01/2023 7:30 AM EDT) Component Value Ref Range Test Method Analysis Time Performed At Pathologist Signature  CRP 105.0 (H) <=10.0 mg/L   01/01/2023 8:00 AM EDT [facility]     Back to top of Lab Results    (ABNORMAL) Sedimentation rate, manual (01/01/2023 7:30 AM EDT) Lab Results - (ABNORMAL) Sedimentation rate, manual (01/01/2023 7:30 AM EDT) Component Value Ref Range Test Method Analysis Time Performed At Pathologist Signature  Sed Rate 31 (H) 0 - 20 mm/h   01/01/2023 7:43 AM EDT [facility]       Back to top of Lab  Results    (ABNORMAL) Basic metabolic panel (01/01/2023 7:30 AM EDT) Lab Results - (ABNORMAL) Basic metabolic panel (01/01/2023 7:30 AM EDT) Component Value Ref Range Test Method Analysis Time Performed At Pathologist Signature  Sodium 138 135 - 145 mmol/L   01/01/2023 8:00 AM EDT [facility]    Potassium 4.0 3.4 - 4.8 mmol/L   01/01/2023 8:00 AM EDT [facility]    Chloride 103 98 - 107 mmol/L   01/01/2023 8:00 AM EDT [facility]    CO2 30.6 20.0 - 31.0 mmol/L   01/01/2023 8:00 AM EDT [facility]    Anion Gap 4 (L) 5 - 14 mmol/L   01/01/2023 8:00 AM EDT [facility]    BUN 17 9 - 23 mg/dL   65/78/4696 2:95 AM EDT [facility]    Creatinine 1.58 (H) 0.73 - 1.18 mg/dL   28/41/3244 0:10 AM EDT [facility]    BUN/Creatinine Ratio 11     01/01/2023 8:00 AM EDT [facility]    eGFR CKD-EPI (2021) Male 50 (L) >=60 mL/min/1.4m2   01/01/2023 8:00 AM EDT [facility]    Comment: eGFR calculated with CKD-EPI 2021 equation in accordance with [employer] and [employer] of Nephrology Task Force recommendations.  Glucose 123 70 - 179 mg/dL   27/25/3664 4:03 AM EDT [facility]    Calcium 9.3 8.7 - 10.4 mg/dL   47/42/5956 3:87 AM EDT [facility]         (ABNORMAL) Body fluid cell count (01/01/2023 7:24 AM EDT) Lab Results - (ABNORMAL) Body fluid cell count (01/01/2023 7:24 AM EDT) Component Value Ref Range Test Method Analysis Time Performed At Pathologist Signature  Fluid Type Fluid, Joint     01/01/2023 12:33 PM EDT [facility]    Color, Fluid Straw     01/01/2023 12:33 PM EDT [facility]    Appearance, Fluid Opaque     01/01/2023 12:33 PM EDT [facility]    Nucleated Cells, Fluid 66,750 Undefined ul   01/01/2023 12:33 PM EDT [facility]    RBC, Fluid 154 ul   01/01/2023 12:33 PM EDT [facility]    Neutrophil %, Fluid 97.0 %   01/01/2023 12:33 PM EDT [facility]    Lymphocytes %, Fluid 1.0 %   01/01/2023 12:33 PM EDT [facility]    Mono/Macro % , Fluid 2.0 %   01/01/2023 12:33 PM EDT [facility]    #Cells Counted BF Diff 100     01/01/2023 12:33 PM EDT [facility]    Crystal Analysis Crystals present (AA) No crystals seen   01/01/2023 12:33 PM EDT [facility]      EKG   Radiology No results found.  Procedures Procedures (including critical care time)  Medications Ordered in UC Medications  dexamethasone (DECADRON) injection 10 mg (has no administration in time  range)    Initial Impression / Assessment and Plan / UC Course  I have reviewed the triage vital signs and the nursing notes.  Pertinent labs & imaging results that were available during my care of the patient were reviewed by me and considered in my medical decision making (see chart for details).   62 year old male with history of gout, obesity, hypertension and asthma presents for 3-day history of left knee pain and swelling.  Seen  in emergency department last night.  Was given a dose of pain medication in the clinic and had fluid removed from the knee for analysis.  I included the lab results above which show elevated ESR of 31 and elevated CRP of 105.  Synovial fluid analysis shows monosodium urate crystals.  Patient was prescribed colchicine and indomethacin but did not pick up these medications.  Of note, GFR was below 60.  On exam today he has swelling and tenderness of the left anterior knee.  No erythema but there is increased warmth compared to opposite knee.  No significant effusion.  Mild effusion at this time.  He does have swelling of the lower leg but no tenderness of the calf and is not reporting any chest pain/tightness, shortness of breath or palpitations.  Oxygen is 91% at rest.  Lungs are clear.  No history of PE or DVT.  Consistent with acute gout flareup.  ED held off on prescribing corticosteroids because they were unsure about potential infection but likely not infectious based on his workup.  Patient normally has to have corticosteroids to improve with his frequent gout flareups.  Will prescribe prednisone taper for him to start tomorrow.  He was given 10 mg IM dexamethasone in clinic.  Sent Norco as needed for severe pain.  Advised also started colchicine but holding off on taking indomethacin since he has reduced GFR.  Discussed RICE guidelines.  Advised keeping appointment with Ortho next week for reevaluation.  ED precautions discussed.   Final Clinical Impressions(s) /  UC Diagnoses   Final diagnoses:  Acute gout of left knee, unspecified cause  Acute pain of left knee     Discharge Instructions      -Start colchicine. Do not take indomethacin -Start prednisone tomorrow since you had a steroid injection today. -May take pain medication as needed starting today -Elevate and ice your knee -Keep appt with ortho next week -If worsening swelling, worsening knee pain, fever, increased pain, chest pain, shortness of breath, go to ER     ED Prescriptions     Medication Sig Dispense Auth. Provider   predniSONE (DELTASONE) 10 MG tablet Take 5 tabs po x 2 days, 4 tab x 2 days, 3 tab x 2 days, 2 tab x 2 days, 1 tab x 2 days. 30 tablet Shirlee Latch, PA-C   HYDROcodone-acetaminophen (NORCO) 7.5-325 MG tablet Take 1 tablet by mouth every 6 (six) hours as needed for up to 3 days for severe pain. 12 tablet Shirlee Latch, PA-C      I have reviewed the PDMP during this encounter.   Shirlee Latch, PA-C 01/02/23 [phone]

## 2023-01-02 NOTE — Discharge Instructions (Addendum)
-  Start colchicine. Do not take indomethacin -Start prednisone tomorrow since you had a steroid injection today. -May take pain medication as needed starting today -Elevate and ice your knee -Keep appt with ortho next week -If worsening swelling, worsening knee pain, fever, increased pain, chest pain, shortness of breath, go to ER

## 2024-03-29 ENCOUNTER — Other Ambulatory Visit: Payer: Self-pay

## 2024-03-29 DIAGNOSIS — I2699 Other pulmonary embolism without acute cor pulmonale: Secondary | ICD-10-CM

## 2024-04-01 ENCOUNTER — Ambulatory Visit
Admission: RE | Admit: 2024-04-01 | Discharge: 2024-04-01 | Disposition: A | Discharge status: Home / Self Care | Source: Ambulatory Visit | Attending: Family Medicine | Admitting: Family Medicine

## 2024-04-01 DIAGNOSIS — I2699 Other pulmonary embolism without acute cor pulmonale: Secondary | ICD-10-CM | POA: Diagnosis present

## 2024-04-01 MED ORDER — IOHEXOL 350 MG/ML SOLN
100.0000 mL | Freq: Once | INTRAVENOUS | Status: AC | PRN
  Administered 2024-04-01: 100 mL via INTRAVENOUS
